# Patient Record
Sex: Male | Born: 1945 | Race: White | Hispanic: No | Marital: Married | Smoking: Never smoker
Health system: Southern US, Community
[De-identification: ages and names within clinical notes are randomized; demographics above are authoritative.]

## PROBLEM LIST (undated history)

## (undated) DIAGNOSIS — M1990 Unspecified osteoarthritis, unspecified site: Secondary | ICD-10-CM

## (undated) DIAGNOSIS — Z8719 Personal history of other diseases of the digestive system: Secondary | ICD-10-CM

## (undated) DIAGNOSIS — I2510 Atherosclerotic heart disease of native coronary artery without angina pectoris: Secondary | ICD-10-CM

## (undated) DIAGNOSIS — Z9889 Other specified postprocedural states: Secondary | ICD-10-CM

## (undated) DIAGNOSIS — N390 Urinary tract infection, site not specified: Secondary | ICD-10-CM

## (undated) DIAGNOSIS — R112 Nausea with vomiting, unspecified: Secondary | ICD-10-CM

## (undated) DIAGNOSIS — K219 Gastro-esophageal reflux disease without esophagitis: Secondary | ICD-10-CM

## (undated) DIAGNOSIS — R7303 Prediabetes: Secondary | ICD-10-CM

## (undated) DIAGNOSIS — J449 Chronic obstructive pulmonary disease, unspecified: Secondary | ICD-10-CM

## (undated) DIAGNOSIS — N400 Enlarged prostate without lower urinary tract symptoms: Secondary | ICD-10-CM

## (undated) DIAGNOSIS — C801 Malignant (primary) neoplasm, unspecified: Secondary | ICD-10-CM

## (undated) DIAGNOSIS — E782 Mixed hyperlipidemia: Secondary | ICD-10-CM

## (undated) DIAGNOSIS — I10 Essential (primary) hypertension: Secondary | ICD-10-CM

## (undated) DIAGNOSIS — Z87442 Personal history of urinary calculi: Secondary | ICD-10-CM

## (undated) DIAGNOSIS — N189 Chronic kidney disease, unspecified: Secondary | ICD-10-CM

## (undated) DIAGNOSIS — Z91018 Allergy to other foods: Secondary | ICD-10-CM

## (undated) HISTORY — DX: Personal history of other diseases of the digestive system: Z87.19

## (undated) HISTORY — DX: Chronic obstructive pulmonary disease, unspecified: J44.9

## (undated) HISTORY — DX: Urinary tract infection, site not specified: N39.0

## (undated) HISTORY — PX: OTHER SURGICAL HISTORY: SHX169

## (undated) HISTORY — DX: Allergy to other foods: Z91.018

## (undated) HISTORY — DX: Atherosclerotic heart disease of native coronary artery without angina pectoris: I25.10

## (undated) HISTORY — PX: KNEE ARTHROSCOPY: SUR90

## (undated) HISTORY — DX: Mixed hyperlipidemia: E78.2

## (undated) HISTORY — DX: Chronic kidney disease, unspecified: N18.9

## (undated) HISTORY — DX: Unspecified osteoarthritis, unspecified site: M19.90

## (undated) HISTORY — DX: Prediabetes: R73.03

## (undated) HISTORY — PX: UPPER GASTROINTESTINAL ENDOSCOPY: SHX188

---

## 2012-09-17 ENCOUNTER — Encounter: Payer: Self-pay | Admitting: Gastroenterology

## 2012-09-17 HISTORY — PX: COLONOSCOPY: SHX174

## 2012-09-17 HISTORY — PX: ESOPHAGOGASTRODUODENOSCOPY: SHX1529

## 2017-04-12 ENCOUNTER — Encounter (HOSPITAL_COMMUNITY): Payer: Self-pay | Admitting: *Deleted

## 2017-04-12 NOTE — Progress Notes (Signed)
Spoke with pt's wife, Tye [place] for pre-op call. She states pt does not have a cardiac history or is diabetic.

## 2017-04-13 ENCOUNTER — Ambulatory Visit (HOSPITAL_COMMUNITY): Payer: Medicare HMO | Admitting: Certified Registered Nurse Anesthetist

## 2017-04-13 ENCOUNTER — Encounter (HOSPITAL_COMMUNITY)
Admission: RE | Disposition: A | Discharge status: Home / Self Care | Payer: Self-pay | Source: Ambulatory Visit | Attending: Oculoplastics Ophthalmology

## 2017-04-13 ENCOUNTER — Ambulatory Visit (HOSPITAL_COMMUNITY)
Admission: RE | Admit: 2017-04-13 | Discharge: 2017-04-13 | Disposition: A | Discharge status: Home / Self Care | Payer: Medicare HMO | Source: Ambulatory Visit | Attending: Oculoplastics Ophthalmology | Admitting: Oculoplastics Ophthalmology

## 2017-04-13 ENCOUNTER — Encounter (HOSPITAL_COMMUNITY): Payer: Self-pay | Admitting: *Deleted

## 2017-04-13 DIAGNOSIS — Z79891 Long term (current) use of opiate analgesic: Secondary | ICD-10-CM | POA: Insufficient documentation

## 2017-04-13 DIAGNOSIS — Z79899 Other long term (current) drug therapy: Secondary | ICD-10-CM | POA: Diagnosis not present

## 2017-04-13 DIAGNOSIS — Z7982 Long term (current) use of aspirin: Secondary | ICD-10-CM | POA: Insufficient documentation

## 2017-04-13 DIAGNOSIS — Z791 Long term (current) use of non-steroidal anti-inflammatories (NSAID): Secondary | ICD-10-CM | POA: Diagnosis not present

## 2017-04-13 DIAGNOSIS — C44119 Basal cell carcinoma of skin of left eyelid, including canthus: Secondary | ICD-10-CM | POA: Insufficient documentation

## 2017-04-13 HISTORY — DX: Malignant (primary) neoplasm, unspecified: C80.1

## 2017-04-13 HISTORY — PX: LID LESION EXCISION: SHX5204

## 2017-04-13 HISTORY — DX: Personal history of urinary calculi: Z87.442

## 2017-04-13 HISTORY — PX: RECONSTRUCTION OF EYELID: SHX6576

## 2017-04-13 HISTORY — DX: Other specified postprocedural states: Z98.890

## 2017-04-13 HISTORY — DX: Essential (primary) hypertension: I10

## 2017-04-13 HISTORY — DX: Other specified postprocedural states: R11.2

## 2017-04-13 HISTORY — DX: Gastro-esophageal reflux disease without esophagitis: K21.9

## 2017-04-13 HISTORY — DX: Benign prostatic hyperplasia without lower urinary tract symptoms: N40.0

## 2017-04-13 HISTORY — DX: Personal history of other diseases of the digestive system: Z87.19

## 2017-04-13 LAB — BASIC METABOLIC PANEL
Anion gap: 7 (ref 5–15)
BUN: 14 mg/dL (ref 6–20)
CHLORIDE: 111 mmol/L (ref 101–111)
CO2: 21 mmol/L — AB (ref 22–32)
Calcium: 9.8 mg/dL (ref 8.9–10.3)
Creatinine, Ser: 0.93 mg/dL (ref 0.61–1.24)
GFR calc non Af Amer: >60 mL/min (ref >60)
Glucose, Bld: 125 mg/dL — ABNORMAL HIGH (ref 65–99)
POTASSIUM: 3.9 mmol/L (ref 3.5–5.1)
Sodium: 139 mmol/L (ref 135–145)

## 2017-04-13 LAB — CBC
HEMATOCRIT: 46.1 % (ref 39.0–52.0)
Hemoglobin: 15.9 g/dL (ref 13.0–17.0)
MCH: 30.3 pg (ref 26.0–34.0)
MCHC: 34.5 g/dL (ref 30.0–36.0)
MCV: 88 fL (ref 78.0–100.0)
PLATELETS: 159 10*3/uL (ref 150–400)
RBC: 5.24 MIL/uL (ref 4.22–5.81)
RDW: 13.4 % (ref 11.5–15.5)
WBC: 7.6 10*3/uL (ref 4.0–10.5)

## 2017-04-13 LAB — PROTIME-INR
INR: 0.97
Prothrombin Time: 12.8 seconds (ref 11.4–15.2)

## 2017-04-13 SURGERY — EXCISION, LESION, EYELID
Anesthesia: General | Site: Eye | Laterality: Left

## 2017-04-13 MED ORDER — OXYCODONE HCL 5 MG/5ML PO SOLN: 5.0000 mg | Freq: Once | ORAL | Status: AC | PRN

## 2017-04-13 MED ORDER — OXYCODONE HCL 5 MG PO TABS
ORAL_TABLET | ORAL | Status: AC
  Filled 2017-04-13: qty 1

## 2017-04-13 MED ORDER — LACTATED RINGERS IV SOLN
INTRAVENOUS | Status: DC | PRN
  Administered 2017-04-13: 09:00:00 via INTRAVENOUS

## 2017-04-13 MED ORDER — BUPIVACAINE HCL (PF) 0.5 % IJ SOLN
INTRAMUSCULAR | Status: AC
  Filled 2017-04-13: qty 30

## 2017-04-13 MED ORDER — TOBRAMYCIN-DEXAMETHASONE 0.3-0.1 % OP OINT
TOPICAL_OINTMENT | OPHTHALMIC | Status: DC | PRN
  Administered 2017-04-13: 1 via OPHTHALMIC

## 2017-04-13 MED ORDER — LIDOCAINE-EPINEPHRINE 1 %-1:100000 IJ SOLN
INTRAMUSCULAR | Status: AC
  Filled 2017-04-13: qty 1

## 2017-04-13 MED ORDER — SCOPOLAMINE 1 MG/3DAYS TD PT72
1.0000 | MEDICATED_PATCH | TRANSDERMAL | Status: DC
  Administered 2017-04-13: 1.5 mg via TRANSDERMAL
  Filled 2017-04-13: qty 1

## 2017-04-13 MED ORDER — ONDANSETRON HCL 4 MG/2ML IJ SOLN
INTRAMUSCULAR | Status: DC | PRN
  Administered 2017-04-13: 4 mg via INTRAVENOUS

## 2017-04-13 MED ORDER — PROPOFOL 10 MG/ML IV BOLUS
INTRAVENOUS | Status: DC | PRN
  Administered 2017-04-13: 180 mg via INTRAVENOUS

## 2017-04-13 MED ORDER — FENTANYL CITRATE (PF) 100 MCG/2ML IJ SOLN
INTRAMUSCULAR | Status: DC | PRN
  Administered 2017-04-13: 50 ug via INTRAVENOUS

## 2017-04-13 MED ORDER — LIDOCAINE-EPINEPHRINE 1 %-1:100000 IJ SOLN
INTRAMUSCULAR | Status: DC | PRN
  Administered 2017-04-13: 10 mL

## 2017-04-13 MED ORDER — LIDOCAINE HCL (CARDIAC) 20 MG/ML IV SOLN
INTRAVENOUS | Status: DC | PRN
  Administered 2017-04-13: 60 mg via INTRAVENOUS

## 2017-04-13 MED ORDER — BUPIVACAINE HCL (PF) 0.75 % IJ SOLN
INTRAMUSCULAR | Status: AC
  Filled 2017-04-13: qty 30

## 2017-04-13 MED ORDER — BUPIVACAINE HCL (PF) 0.75 % IJ SOLN
INTRAMUSCULAR | Status: AC
  Filled 2017-04-13: qty 10

## 2017-04-13 MED ORDER — PHENYLEPHRINE HCL 10 MG/ML IJ SOLN
INTRAMUSCULAR | Status: DC | PRN
  Administered 2017-04-13: 40 ug via INTRAVENOUS

## 2017-04-13 MED ORDER — DEXAMETHASONE SODIUM PHOSPHATE 10 MG/ML IJ SOLN
INTRAMUSCULAR | Status: DC | PRN
  Administered 2017-04-13: 10 mg via INTRAVENOUS

## 2017-04-13 MED ORDER — TOBRAMYCIN-DEXAMETHASONE 0.3-0.1 % OP OINT
TOPICAL_OINTMENT | OPHTHALMIC | Status: AC
  Filled 2017-04-13: qty 3.5

## 2017-04-13 MED ORDER — ONDANSETRON HCL 4 MG/2ML IJ SOLN: 4.0000 mg | Freq: Four times a day (QID) | INTRAMUSCULAR | Status: DC | PRN

## 2017-04-13 MED ORDER — FENTANYL CITRATE (PF) 250 MCG/5ML IJ SOLN
INTRAMUSCULAR | Status: AC
  Filled 2017-04-13: qty 5

## 2017-04-13 MED ORDER — OXYCODONE HCL 5 MG PO TABS
5.0000 mg | ORAL_TABLET | Freq: Once | ORAL | Status: AC | PRN
  Administered 2017-04-13: 5 mg via ORAL

## 2017-04-13 MED ORDER — HYDROCODONE-ACETAMINOPHEN 5-325 MG PO TABS: 1.0000 | ORAL_TABLET | Freq: Four times a day (QID) | ORAL | 0 refills | Status: AC | PRN

## 2017-04-13 MED ORDER — FENTANYL CITRATE (PF) 100 MCG/2ML IJ SOLN: 25.0000 ug | INTRAMUSCULAR | Status: DC | PRN

## 2017-04-13 SURGICAL SUPPLY — 39 items
APPLICATOR COTTON TIP 6IN STRL (MISCELLANEOUS) ×3 IMPLANT
APPLICATOR DR MATTHEWS STRL (MISCELLANEOUS) ×3 IMPLANT
BANDAGE EYE OVAL (MISCELLANEOUS) ×3 IMPLANT
BLADE SURG 15 STRL LF DISP TIS (BLADE) ×1 IMPLANT
BLADE SURG 15 STRL SS (BLADE) ×2
CLOSURE STERI-STRIP 1/2X4 (GAUZE/BANDAGES/DRESSINGS) ×1
CLOSURE WOUND 1/2 X4 (GAUZE/BANDAGES/DRESSINGS) ×1
CLSR STERI-STRIP ANTIMIC 1/2X4 (GAUZE/BANDAGES/DRESSINGS) ×2 IMPLANT
CORDS BIPOLAR (ELECTRODE) ×3 IMPLANT
COVER LIGHT HANDLE STERIS (MISCELLANEOUS) ×3 IMPLANT
COVER SURGICAL LIGHT HANDLE (MISCELLANEOUS) ×3 IMPLANT
DRAPE ORTHO SPLIT 87X125 STRL (DRAPES) ×3 IMPLANT
DRAPE SURG 17X23 STRL (DRAPES) ×6 IMPLANT
DRAPE UTILITY XL STRL (DRAPES) ×3 IMPLANT
DRESSING TELFA 8X10 (GAUZE/BANDAGES/DRESSINGS) IMPLANT
ELECT NEEDLE BLADE 2-5/6 (NEEDLE) ×3 IMPLANT
ELECT REM PT RETURN 9FT ADLT (ELECTROSURGICAL) ×3
ELECTRODE REM PT RTRN 9FT ADLT (ELECTROSURGICAL) ×1 IMPLANT
FORCEPS BIPOLAR SPETZLER 8 1.0 (NEUROSURGERY SUPPLIES) ×3 IMPLANT
FRAME EYE SHIELD (PROTECTIVE WEAR) IMPLANT
GLOVE BIO SURGEON STRL SZ7.5 (GLOVE) ×6 IMPLANT
GOWN STRL REUS W/ TWL LRG LVL3 (GOWN DISPOSABLE) ×2 IMPLANT
GOWN STRL REUS W/TWL LRG LVL3 (GOWN DISPOSABLE) ×4
NEEDLE PRECISIONGLIDE 27X1.5 (NEEDLE) IMPLANT
NS IRRIG 1000ML POUR BTL (IV SOLUTION) ×3 IMPLANT
PACK CATARACT CUSTOM (CUSTOM PROCEDURE TRAY) ×3 IMPLANT
PAD ARMBOARD 7.5X6 YLW CONV (MISCELLANEOUS) ×6 IMPLANT
PENCIL BUTTON HOLSTER BLD 10FT (ELECTRODE) ×3 IMPLANT
STRIP CLOSURE SKIN 1/2X4 (GAUZE/BANDAGES/DRESSINGS) ×2 IMPLANT
SUT CHROMIC 5 0 P 3 (SUTURE) ×3 IMPLANT
SUT ETHILON 6 0 P 1 (SUTURE) IMPLANT
SUT ETHILON 7 0 P 6 18 (SUTURE) ×3 IMPLANT
SUT MERSILENE 5 0 RD 1 DA (SUTURE) ×3 IMPLANT
SUT PLAIN 5 0 P 3 18 (SUTURE) ×3 IMPLANT
SUT PLAIN 6 0 TG1408 (SUTURE) ×3 IMPLANT
SUT SILK 4 0 P 3 (SUTURE) ×3 IMPLANT
SUT VICRYL 6-0 S14 (SUTURE) ×3 IMPLANT
TOWEL OR 17X24 6PK STRL BLUE (TOWEL DISPOSABLE) ×6 IMPLANT
WATER STERILE IRR 1000ML POUR (IV SOLUTION) ×3 IMPLANT

## 2017-04-13 NOTE — Brief Op Note (Signed)
04/13/2017  8:26 AM  PATIENT:  Ryan Zamora  71 y.o. 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  BLOOD ADMINISTERED:none  DRAINS: none   LOCAL MEDICATIONS USED:  BUPIVICAINE  and LIDOCAINE   SPECIMEN:  Source of Specimen:  Left lower eyelid  DISPOSITION OF SPECIMEN:  PATHOLOGY  COUNTS:  YES  TOURNIQUET:  * No tourniquets in log *  DICTATION: .Note written in EPIC  PLAN OF CARE: Discharge to home after PACU  PATIENT DISPOSITION:  PACU - hemodynamically stable.   Delay start of Pharmacological VTE agent (>24hrs) due to surgical blood loss or risk of bleeding: not applicable

## 2017-04-13 NOTE — Anesthesia Procedure Notes (Signed)
Procedure Name: LMA Insertion Date/Time: 04/13/2017 8:53 AM Performed by: Tressia Miners LEFFEW Pre-anesthesia Checklist: Patient identified, Emergency Drugs available, Suction available and Patient being monitored Patient Re-evaluated:Patient Re-evaluated prior to induction Oxygen Delivery Method: Circle System Utilized Preoxygenation: Pre-oxygenation with 100% oxygen Induction Type: IV induction Ventilation: Mask ventilation without difficulty LMA: LMA inserted LMA Size: 4.0 Number of attempts: 1 Airway Equipment and Method: Bite block Placement Confirmation: positive ETCO2 and breath sounds checked- equal and bilateral Tube secured with: Tape Dental Injury: Teeth and Oropharynx as per pre-operative assessment

## 2017-04-13 NOTE — H&P (Signed)
Subjective:    Ryan Zamora is a 71 y.o. male who presents for evaluation of left lower eyelid basal cell cancer. The pain is described as none. Onset was several years ago. Symptoms have been unchanged since.   Review of Systems Pertinent items are noted in HPI.    Objective:   BP 139/74   Pulse 76   Temp 97.8 F (36.6 C) (Oral)   Resp 20   Ht 5\' 10"  (1.778 m)   Wt 92.5 kg (204 lb)   SpO2 97%   BMI 29.27 kg/m   General:  alert, cooperative and appears stated age Skin:  normal and left lower eyelid basal cell cancer s/p incisional biopsy Eyes: conjunctivae/corneas clear. PERRL, EOM's intact. Fundi benign., left lower lid basal cell cancer approximately 30% of lower eyelid VA 20/50+1 OS and 20/30-2 OS Mouth: MMM no lesions Lymph Nodes:  Cervical, supraclavicular, and axillary nodes normal. Lungs:  clear to auscultation bilaterally Heart:  regular rate and rhythm, S1, S2 normal, no murmur, click, rub or gallop Abdomen: soft, non-tender; bowel sounds normal; no masses,  no organomegaly CVA:  absent Genitourinary: defer exam Extremities:  extremities normal, atraumatic, no cyanosis or edema Neurologic:  negative Psychiatric:  normal mood, behavior, speech, dress, and thought processes    Assessment: LEFT Lower Eyelid Basal Cell Cancer  Plan: Excisional Biopsy with Frozen Section and Total Reconstruction of Left Lower Eyelid  1. Discussed the risk of surgery,  and the risks of general anesthetic including MI, CVA, sudden death or even reaction to anesthetic medications. The patient understands the risks, any and all questions were answered to the patient's satisfaction. 2. Follow up: 2 weeks.  Date of Surgery Update (To be completed by Attending Surgeon day of surgery.)

## 2017-04-13 NOTE — Anesthesia Preprocedure Evaluation (Signed)
Anesthesia Evaluation  Patient identified by MRN, date of birth, ID band Patient awake    Reviewed: Allergy & Precautions, H&P , NPO status , Patient's Chart, lab work & pertinent test results  History of Anesthesia Complications (+) PONV and history of anesthetic complications  Airway Mallampati: II   Neck ROM: full    Dental   Pulmonary neg pulmonary ROS,    breath sounds clear to auscultation       Cardiovascular hypertension,  Rhythm:regular Rate:Normal     Neuro/Psych    GI/Hepatic hiatal hernia, GERD  ,  Endo/Other    Renal/GU stones     Musculoskeletal   Abdominal   Peds  Hematology   Anesthesia Other Findings   Reproductive/Obstetrics                             Anesthesia Physical Anesthesia Plan  ASA: II  Anesthesia Plan: General   Post-op Pain Management:    Induction: Intravenous  PONV Risk Score and Plan: 3 and Ondansetron, Dexamethasone and Midazolam  Airway Management Planned: LMA  Additional Equipment:   Intra-op Plan:   Post-operative Plan:   Informed Consent: I have reviewed the patients History and Physical, chart, labs and discussed the procedure including the risks, benefits and alternatives for the proposed anesthesia with the patient or authorized representative who has indicated his/her understanding and acceptance.     Plan Discussed with: CRNA, Anesthesiologist and Surgeon  Anesthesia Plan Comments:         Anesthesia Quick Evaluation

## 2017-04-13 NOTE — Op Note (Signed)
Procedure(s): REMOVAL BIOPSY OF EYE LID LESION TOTAL RECONSTRUCTION OF LOWER EYELID Procedure Note  Ryan Zamora male 71 y.o. 04/13/2017  Procedure(s) and Anesthesia Type:    * REMOVAL BIOPSY OF EYE LID LESION - General    * TOTAL RECONSTRUCTION OF LOWER EYELID - General  Surgeon(s) and Role:    * Clista Bernhardt, MD - Primary   Indications: The patient was admitted to the hospital with a brief history of left lower eyelid basal cell cancer. An incisional biopsy revealed findings of basal cell cancer. The patient now presents for excisional biopsy with frozen section and total reconstruction of the lower eyelid after discussing therapeutic alternatives.        Surgeon: Clista Bernhardt   Assistants: none  Anesthesia: General LMA anesthesia  ASA Class: 3  Procedure Detail  REMOVAL BIOPSY OF EYE LID LESION, TOTAL RECONSTRUCTION OF LOWER EYELID The patient is aware that they have a basal cell cancer, necessitating excision. The patient is aware of the risks and benefits of surgery including bleeding, infection, scarring, need for re-excision, asymmetry, and loss of the eye, and loss of life. The patient elects to proceed.  The patient was transferred to the OR in supine position where they were prepped and draped in the usual standard sterile fashion for Oculoplastic surgery at [place]. Attention was turned to the left lower eyelid which was then infiltrated with local anesthesia. The area of visible basal cell was incised with a 15 blade and then excised with a Wescotts scissors, hemostasis was maintained with monopolar cautery.   The lesion involved approximately 40% of the lower eyelid margin so a Tenzel flap was created. Starting from the lateral canthus a ellipse was drawn with the marking pen. Then the 15 blade was used to incise the area. Then Wescotts and the Bovie was used to dissect the tissue to release it lateral to the lateral canthus. An inferior  lateral  canthotomy was done to allow for adequate mobilization of the flap to allow complete closure of the eyelid defect. Hemostasis was maintained with monopolar cautery. The deep orbicular was closed with a mattress suture lateral to the lateral canthus to stabilize the flap.   The lid margin was then closed from meibomian gland orifice to meibomian gland orifice and then from gray line to gray line with a 5-0 undyed vicryl. Then 2 buried mattress sutures were placed to bring the incized edges together. Then the skin was closed in an interrupted fashion with 6-0 plain gut.   Then the Tenzel flap was closed in a running fashion with 6-0 plain gut.   Findings: Left lower eyelid basal cell cancer  Estimated Blood Loss:  Minimal         Drains: none         Total IV Fluids: <1L  Blood Given: none          Specimens: left lower eyelid         Implants: none        Complications:  * No complications entered in OR log *         Disposition: PACU - hemodynamically stable.         Condition: stable

## 2017-04-13 NOTE — Transfer of Care (Signed)
Immediate Anesthesia Transfer of Care Note  Patient: Ryan Zamora  Procedure(s) Performed: Procedure(s): REMOVAL BIOPSY OF EYE LID LESION (Left) TOTAL RECONSTRUCTION OF LOWER EYELID (Left)  Patient Location: PACU  Anesthesia Type:General  Level of Consciousness: awake, oriented, patient cooperative and responds to stimulation  Airway & Oxygen Therapy: Patient Spontanous Breathing and Patient connected to face mask oxygen  Post-op Assessment: Report given to RN, Post -op Vital signs reviewed and stable and Patient moving all extremities X 4  Post vital signs: Reviewed and stable  Last Vitals:  Vitals:   04/13/17 0703  BP: 139/74  Pulse: 76  Resp: 20  Temp: 36.6 C  SpO2: 97%    Last Pain:  Vitals:   04/13/17 0703  TempSrc: Oral      Patients Stated Pain Goal: 3 (03/40/35 2481)  Complications: No apparent anesthesia complications

## 2017-04-13 NOTE — Anesthesia Postprocedure Evaluation (Signed)
Anesthesia Post Note  Patient: Ryan Zamora  Procedure(s) Performed: Procedure(s) (LRB): REMOVAL BIOPSY OF EYE LID LESION (Left) TOTAL RECONSTRUCTION OF LOWER EYELID (Left)     Patient location during evaluation: PACU Anesthesia Type: General Level of consciousness: awake and alert Pain management: pain level controlled Vital Signs Assessment: post-procedure vital signs reviewed and stable Respiratory status: spontaneous breathing, nonlabored ventilation, respiratory function stable and patient connected to nasal cannula oxygen Cardiovascular status: blood pressure returned to baseline and stable Postop Assessment: no signs of nausea or vomiting Anesthetic complications: no    Last Vitals:  Vitals:   04/13/17 1020 04/13/17 1037  BP: (!) 151/82   Pulse:  72  Resp:  15  Temp:    SpO2:  96%    Last Pain:  Vitals:   04/13/17 1037  TempSrc:   PainSc: 2                  Rahil Passey S

## 2017-04-14 ENCOUNTER — Encounter (HOSPITAL_COMMUNITY): Payer: Self-pay | Admitting: Oculoplastics Ophthalmology

## 2018-03-30 ENCOUNTER — Encounter: Payer: Self-pay | Admitting: Gastroenterology

## 2018-04-30 ENCOUNTER — Encounter: Payer: Self-pay | Admitting: Gastroenterology

## 2018-05-02 ENCOUNTER — Ambulatory Visit (INDEPENDENT_AMBULATORY_CARE_PROVIDER_SITE_OTHER): Payer: Medicare HMO | Admitting: Gastroenterology

## 2018-05-02 ENCOUNTER — Encounter: Payer: Self-pay | Admitting: Gastroenterology

## 2018-05-02 VITALS — BP 134/70 | HR 80 | Ht 70.0 in | Wt 203.4 lb

## 2018-05-02 DIAGNOSIS — R1310 Dysphagia, unspecified: Secondary | ICD-10-CM

## 2018-05-02 DIAGNOSIS — K2270 Barrett's esophagus without dysplasia: Secondary | ICD-10-CM

## 2018-05-02 DIAGNOSIS — K222 Esophageal obstruction: Secondary | ICD-10-CM

## 2018-05-02 DIAGNOSIS — K219 Gastro-esophageal reflux disease without esophagitis: Secondary | ICD-10-CM

## 2018-05-02 DIAGNOSIS — K22719 Barrett's esophagus with dysplasia, unspecified: Secondary | ICD-10-CM

## 2018-05-02 DIAGNOSIS — K449 Diaphragmatic hernia without obstruction or gangrene: Secondary | ICD-10-CM

## 2018-05-02 MED ORDER — PANTOPRAZOLE SODIUM 40 MG PO TBEC: 40.0000 mg | DELAYED_RELEASE_TABLET | Freq: Two times a day (BID) | ORAL | 11 refills | Status: DC

## 2018-05-02 NOTE — Progress Notes (Signed)
Chief Complaint: Dysphagia  Referring Provider:  Raina Mina., MD   Dr Alcide Clever   ASSESSMENT AND PLAN;   #1. 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 Now with esophageal dysphagia #2. Barrett's esophagus (status post EGD with dil/bx 09/2012, no dysplasia) Plan: - Switch Nexium twice daily to Protonix 40mg  po bid. - Ba swallow with barium tablet. - EGD with dil and Bx.  I discussed risks and benefits. - Trial of clariten 10mg  po qd x 5 days. - Nonpharmacologic means of reflux control were discussed. - I have instructed patient that she needs to chew foods especially meats and breads well and eat slowly. - Discussed above in detail with the patient and patient's wife. - Proceed with sleep study scheduled for next week.   HPI:    Ryan Zamora is a 72 y.o. male  With dysphagia/occasional regurgitation/choking. Sleeps on wedge Has been having pulm eval - Dr Samara Snide -neg PFTs- awiating sleep study next week. Advised to get repeat GI evaluation for intermittent dysphagia. Patient did take doxycycline recently for prostatitis. Has been having intermittent dysphagia mostly to solids especially beef Occasional heartburn despite of Nexium. No odynophagia No melena No significant nonsteroidals Denies having any abdominal pain. No diarrhea or constipation. He has been sent letter to get EGD performed in 2017 for follow-up of Barrett's.  But, he did not come until today.  Past GI procedures: -EGD 09/17/2012:-Esophageal Barrett's stricture status post dilatation [address] [place] [address], long segment Barrett's esophagus, moderate hiatal hernia. Bx: No dysplasia.  EGD 05/2009: Hiatal hernia, Barrett's esophagus negative dysplasia. -Colonoscopy 09/17/2012 small internal hemorrhoids.  Repeat in 10 years.  Previous colonoscopy 08/2005 sigmoid polyps status post polypectomy, mild sigmoid diverticulosis.  Bx: tubular adenoma. Past Medical History:  Diagnosis Date  . Cancer  (HCC)    basal cell carcinoma, left eyelid, ears  . Enlarged prostate   . GERD (gastroesophageal reflux disease)   . History of hiatal hernia   . History of kidney stones   . Hypertension   . PONV (postoperative nausea and vomiting)   . UTI (urinary tract infection)     Past Surgical History:  Procedure Laterality Date  . COLONOSCOPY  09/17/2012   Small internal hemorrhoids. Otherwise normal colonoscopy to cecum.   . 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 Long segment Barretts esophagus. Moderate sized hiatal hernia.   Marland Kitchen kidney stone surgery    . KNEE ARTHROSCOPY    . LID LESION EXCISION Left 04/13/2017   Procedure: REMOVAL BIOPSY OF EYE LID LESION;  Surgeon: Clista Bernhardt, MD;  Location: [place];  Service: Ophthalmology;  Laterality: Left;  . RECONSTRUCTION OF EYELID Left 04/13/2017   Procedure: TOTAL RECONSTRUCTION OF LOWER EYELID;  Surgeon: Clista Bernhardt, MD;  Location: [place];  Service: Ophthalmology;  Laterality: Left;    Family History  Problem Relation Age of Onset  . CVA Mother   . Heart attack Father   . Colon cancer Neg Hx   . Esophageal cancer Neg Hx     Social History   Tobacco Use  . Smoking status: Never Smoker  . Smokeless tobacco: Never Used  Substance Use Topics  . Alcohol use: Yes    Comment: rare  . Drug use: No    Current Outpatient Medications  Medication Sig Dispense Refill  . allopurinol (ZYLOPRIM) 300 MG tablet Take 300 mg by mouth every evening.    Marland Kitchen aspirin EC  81 MG tablet Take 81 mg by mouth daily.     Marland Kitchen esomeprazole (NEXIUM) 20 MG capsule Take 20 mg by mouth at bedtime as needed (heartburn).    . finasteride (PROSCAR) 5 MG tablet Take 5 mg by mouth daily.    Marland Kitchen lisinopril (PRINIVIL,ZESTRIL) 10 MG tablet Take 10 mg by mouth daily.    . simvastatin (ZOCOR) 20 MG tablet Take 20 mg by mouth every evening.     No current facility-administered medications  for this visit.     No Known Allergies  Review of Systems:  Constitutional: Denies fever, chills, diaphoresis, appetite change and fatigue.  HEENT: Denies photophobia, eye pain, redness, hearing loss, ear pain, congestion, sore throat, rhinorrhea, sneezing, mouth sores, neck pain, neck stiffness and tinnitus.   Respiratory: Denies SOB, DOE, chest tightness,  and wheezing. Has cough. Cardiovascular: Denies chest pain, palpitations and leg swelling.  Genitourinary: Denies dysuria, urgency, frequency, hematuria, flank pain and difficulty urinating.  Musculoskeletal: Denies myalgias, back pain, joint swelling, arthralgias and gait problem.  Skin: No rash.  Neurological: Denies dizziness, seizures, syncope, weakness, light-headedness, numbness and headaches.  Hematological: Denies adenopathy. Easy bruising, personal or family bleeding history  Psychiatric/Behavioral: No anxiety or depression     Physical Exam:    BP 134/70   Pulse 80   Ht 5\' 10"  (1.778 m)   Wt 203 lb 6 oz (92.3 kg)   BMI 29.18 kg/m  Filed Weights   05/02/18 0941  Weight: 203 lb 6 oz (92.3 kg)   Constitutional:  Well-developed, in no acute distress. Psychiatric: Normal mood and affect. Behavior is normal. HEENT: Pupils normal.  Conjunctivae are normal. No scleral icterus. Neck supple.  Cardiovascular: Normal rate, regular rhythm. No edema Pulmonary/chest: Effort normal and breath sounds normal. No wheezing, rales or rhonchi. Abdominal: Soft, nondistended. Nontender. Bowel sounds active throughout. There are no masses palpable. No hepatomegaly. Rectal:  defered Neurological: Alert and oriented to person place and time. Skin: Skin is warm and dry. No rashes noted.  Data Reviewed: I have personally reviewed following labs and imaging studies  CBC: CBC Latest Ref Rng & Units 04/13/2017  WBC 4.0 - 10.5 K/uL 7.6  Hemoglobin 13.0 - 17.0 g/dL 15.9  Hematocrit 39.0 - 52.0 % 46.1  Platelets 150 - 400 K/uL 159     CMP: CMP Latest Ref Rng & Units 04/13/2017  Glucose 65 - 99 mg/dL 125(H)  BUN 6 - 20 mg/dL 14  Creatinine 0.61 - 1.24 mg/dL 0.93  Sodium 135 - 145 mmol/L 139  Potassium 3.5 - 5.1 mmol/L 3.9  Chloride 101 - 111 mmol/L 111  CO2 22 - 32 mmol/L 21(L)  Calcium 8.9 - 10.3 mg/dL 9.8      Carmell Austria, MD 05/02/2018, 10:10 AM  Cc: Raina Mina., MD, Dr Alcide Clever

## 2018-05-02 NOTE — Patient Instructions (Signed)
If you are age 71 or older, your body mass index should be between 23-30. Your Body mass index is 29.18 kg/m. If this is out of the aforementioned range listed, please consider follow up with your Primary Care Provider.  If you are age 81 or younger, your body mass index should be between 19-25. Your Body mass index is 29.18 kg/m. If this is out of the aformentioned range listed, please consider follow up with your Primary Care Provider.   We have sent the following medications to your pharmacy for you to pick up at your convenience: Protonix 40 mg twice daily.  You have been scheduled for a Barium Esophogram at [facility] (1st floor of the hospital) on 05/17/18 at 9:30am. Please arrive 15 minutes prior to your appointment for registration. Make certain not to have anything to eat or drink 3 hours prior to your test. If you need to reschedule for any reason, please contact radiology at [phone] to do so. __________________________________________________________________ A barium swallow is an examination that concentrates on views of the esophagus. This tends to be a double contrast exam (barium and two liquids which, when combined, create a gas to distend the wall of the oesophagus) or single contrast (non-ionic iodine based). The study is usually tailored to your symptoms so a good history is essential. Attention is paid during the study to the form, structure and configuration of the esophagus, looking for functional disorders (such as aspiration, dysphagia, achalasia, motility and reflux) EXAMINATION You may be asked to change into a gown, depending on the type of swallow being performed. A radiologist and radiographer will perform the procedure. The radiologist will advise you of the type of contrast selected for your procedure and direct you during the exam. You will be asked to stand, sit or lie in several different positions and to hold a small amount of fluid in your mouth before  being asked to swallow while the imaging is performed .In some instances you may be asked to swallow barium coated marshmallows to assess the motility of a solid food bolus. The exam can be recorded as a digital or video fluoroscopy procedure. POST PROCEDURE It will take 1-2 days for the barium to pass through your system. To facilitate this, it is important, unless otherwise directed, to increase your fluids for the next 24-48hrs and to resume your normal diet.  This test typically takes about 30 minutes to perform. __________________________________________________________________________________   Ryan Zamora have been scheduled for an endoscopy. Please follow written instructions given to you at your visit today. If you use inhalers (even only as needed), please bring them with you on the day of your procedure. Your physician has requested that you go to www.startemmi.com and enter the access code given to you at your visit today. This web site gives a general overview about your procedure. However, you should still follow specific instructions given to you by our office regarding your preparation for the procedure.   Thank you,  Dr. Jackquline Denmark

## 2018-05-17 ENCOUNTER — Ambulatory Visit (HOSPITAL_COMMUNITY)
Admission: RE | Admit: 2018-05-17 | Discharge: 2018-05-17 | Disposition: A | Discharge status: Home / Self Care | Payer: Medicare HMO | Source: Ambulatory Visit | Attending: Gastroenterology | Admitting: Gastroenterology

## 2018-05-17 ENCOUNTER — Encounter: Payer: Self-pay | Admitting: Gastroenterology

## 2018-05-17 DIAGNOSIS — K219 Gastro-esophageal reflux disease without esophagitis: Secondary | ICD-10-CM | POA: Diagnosis present

## 2018-05-17 DIAGNOSIS — K449 Diaphragmatic hernia without obstruction or gangrene: Secondary | ICD-10-CM | POA: Insufficient documentation

## 2018-05-17 DIAGNOSIS — K222 Esophageal obstruction: Secondary | ICD-10-CM

## 2018-05-17 DIAGNOSIS — K22719 Barrett's esophagus with dysplasia, unspecified: Secondary | ICD-10-CM | POA: Insufficient documentation

## 2018-05-23 ENCOUNTER — Telehealth: Payer: Self-pay | Admitting: Gastroenterology

## 2018-05-23 NOTE — Telephone Encounter (Signed)
Pt calling for barium swallow results. Please advise.

## 2018-05-23 NOTE — Telephone Encounter (Signed)
Barium swallow shows moderate sliding hiatal hernia No stricture Plan to proceed with EGD with biopsies and dilatation Please see comment/plan as per last clinic note.

## 2018-05-24 NOTE — Telephone Encounter (Signed)
Left message for pt to call back  °

## 2018-05-25 ENCOUNTER — Telehealth: Payer: Self-pay | Admitting: Gastroenterology

## 2018-05-25 NOTE — Telephone Encounter (Signed)
Pt calling for results of barium swallow, please advise.

## 2018-05-25 NOTE — Telephone Encounter (Signed)
Pt is wanting results from test done on 10/3 esophagram

## 2018-05-27 NOTE — Telephone Encounter (Signed)
See note dated 10/9

## 2018-05-28 NOTE — Telephone Encounter (Signed)
Pt aware of results and knows to keep EGD appt as scheduled.

## 2018-05-28 NOTE — Telephone Encounter (Signed)
Left message for pt to call back  °

## 2018-05-28 NOTE — Telephone Encounter (Signed)
Pt's wife Juliann Pulse returned your call and would like a call back.

## 2018-05-31 ENCOUNTER — Ambulatory Visit (AMBULATORY_SURGERY_CENTER): Payer: Medicare HMO | Admitting: Gastroenterology

## 2018-05-31 ENCOUNTER — Encounter: Payer: Self-pay | Admitting: Gastroenterology

## 2018-05-31 VITALS — BP 113/76 | HR 67 | Temp 97.8°F | Resp 15 | Ht 70.0 in | Wt 203.0 lb

## 2018-05-31 DIAGNOSIS — K3189 Other diseases of stomach and duodenum: Secondary | ICD-10-CM

## 2018-05-31 DIAGNOSIS — K449 Diaphragmatic hernia without obstruction or gangrene: Secondary | ICD-10-CM

## 2018-05-31 DIAGNOSIS — K2970 Gastritis, unspecified, without bleeding: Secondary | ICD-10-CM | POA: Diagnosis not present

## 2018-05-31 DIAGNOSIS — K2990 Gastroduodenitis, unspecified, without bleeding: Secondary | ICD-10-CM

## 2018-05-31 DIAGNOSIS — K22719 Barrett's esophagus with dysplasia, unspecified: Secondary | ICD-10-CM

## 2018-05-31 MED ORDER — SODIUM CHLORIDE 0.9 % IV SOLN: 500.0000 mL | Freq: Once | INTRAVENOUS | Status: DC

## 2018-05-31 NOTE — Patient Instructions (Addendum)
Impression/Recommendations:  Barrett's esophagus handout given to patient. Hiatal hernia handout given to patient. Gastritis handout given to patient. Dilation diet handout given to patient.  Continue Protonix 40 mg. 2 times daily for 4 weeks, then decrease it to once daily.  Repeat upper endoscopy in 3 years for surveillance based on pathology results.  Return to GI clinic as needed.  YOU HAD AN ENDOSCOPIC PROCEDURE TODAY AT [facility]:   Refer to the procedure report that was given to you for any specific questions about what was found during the examination.  If the procedure report does not answer your questions, please call your gastroenterologist to clarify.  If you requested that your care partner not be given the details of your procedure findings, then the procedure report has been included in a sealed envelope for you to review at your convenience later.  YOU SHOULD EXPECT: Some feelings of bloating in the abdomen. Passage of more gas than usual.  Walking can help get rid of the air that was put into your GI tract during the procedure and reduce the bloating. If you had a lower endoscopy (such as a colonoscopy or flexible sigmoidoscopy) you may notice spotting of blood in your stool or on the toilet paper. If you underwent a bowel prep for your procedure, you may not have a normal bowel movement for a few days.  Please Note:  You might notice some irritation and congestion in your nose or some drainage.  This is from the oxygen used during your procedure.  There is no need for concern and it should clear up in a day or so.  SYMPTOMS TO REPORT IMMEDIATELY:   Following upper endoscopy (EGD)  Vomiting of blood or coffee ground material  New chest pain or pain under the shoulder blades  Painful or persistently difficult swallowing  New shortness of breath  Fever of 100F or higher  Black, tarry-looking stools  For urgent or emergent issues, a gastroenterologist  can be reached at any hour by calling [phone].   DIET:  We do recommend a small meal at first, but then you may proceed to your regular diet.  Drink plenty of fluids but you should avoid alcoholic beverages for 24 hours.  ACTIVITY:  You should plan to take it easy for the rest of today and you should NOT DRIVE or use heavy machinery until tomorrow (because of the sedation medicines used during the test).    FOLLOW UP: Our staff will call the number listed on your records the next business day following your procedure to check on you and address any questions or concerns that you may have regarding the information given to you following your procedure. If we do not reach you, we will leave a message.  However, if you are feeling well and you are not experiencing any problems, there is no need to return our call.  We will assume that you have returned to your regular daily activities without incident.  If any biopsies were taken you will be contacted by phone or by letter within the next 1-3 weeks.  Please call us at [phone] if you have not heard about the biopsies in 3 weeks.    SIGNATURES/CONFIDENTIALITY: You and/or your care partner have signed paperwork which will be entered into your electronic medical record.  These signatures attest to the fact that that the information above on your After Visit Summary has been reviewed and is understood.  Full responsibility of the  confidentiality of this discharge information lies with you and/or your care-partner. 

## 2018-05-31 NOTE — Progress Notes (Signed)
I have reviewed the patient's medical history in detail and updated the computerized patient record.

## 2018-05-31 NOTE — Progress Notes (Signed)
Called to room to assist during endoscopic procedure.  Patient ID and intended procedure confirmed with present staff. Received instructions for my participation in the procedure from the performing physician.  

## 2018-05-31 NOTE — Op Note (Signed)
[facility] Patient Name: Ryan Zamora Procedure Date: 05/31/2018 10:07 AM MRN: [id number] Endoscopist: Jackquline Denmark , MD Age: 72 Referring MD:  Date of Birth: 12-16-1945 Gender: Male Account #: [id number] Procedure:                Upper GI endoscopy Indications:              #1. GERD with mod HH with H/O esophageal stricture                            status post dilatation. Now with esophageal                            dysphagia                           #2. Barrett's esophagus (status post EGD with                            dil/bx 09/2012, no dysplasia) Medicines:                Monitored Anesthesia Care Procedure:                Pre-Anesthesia Assessment:                           - Prior to the procedure, a History and Physical                            was performed, and patient medications and                            allergies were reviewed. The patient's tolerance of                            previous anesthesia was also reviewed. The risks                            and benefits of the procedure and the sedation                            options and risks were discussed with the patient.                            All questions were answered, and informed consent                            was obtained. Prior Anticoagulants: The patient has                            taken no previous anticoagulant or antiplatelet                            agents. ASA Grade Assessment: II - A patient with  mild systemic disease. After reviewing the risks                            and benefits, the patient was deemed in                            satisfactory condition to undergo the procedure.                           After obtaining informed consent, the endoscope was                            passed under direct vision. Throughout the                            procedure, the patient's blood pressure, pulse, and                             oxygen saturations were monitored continuously. The                            Model GIF-HQ190 [id number]) scope was introduced                            through the mouth, and advanced to the second part                            of duodenum. The upper GI endoscopy was                            accomplished without difficulty. The patient                            tolerated the procedure well. Scope In: Scope Out: Findings:                 One benign-appearing, intrinsic mild stenosis was                            found 25 cm from the incisors. This stenosis                            measured 1.4 cm (inner diameter). The stenosis was                            traversed. The scope was withdrawn. Dilation was                            performed with a Maloney dilator with mild                            resistance at 48Fr and 50 Fr.                           Barrett's esophagus  was present extending from 25                            to 35 cm. The maximum longitudinal extent of these                            mucosal changes was 10 cm in length. Mucosa was                            biopsied with a cold forceps for histology in 4                            quadrants at intervals of 1.5 cm. Estimated blood                            loss was minimal.                           A 5 cm hiatal hernia was present extending from 35                            cm up to 40 cm (GE junction to diaphragmatic                            hiatus).                           Localized mild inflammation characterized by                            erythema was found in the gastric antrum. Biopsies                            were taken with a cold forceps for histology.                            Estimated blood loss: none.                           The exam was otherwise without abnormality. Complications:            No immediate complications. Estimated Blood Loss:     Estimated blood loss:  none. Impression:               - Benign-appearing esophageal stenosis. Dilated.                           - Barrett's esophagus. Biopsied.                           - Medium-sized hiatal hernia.                           - Mild Gastritis. Biopsied.                           -  The examination was otherwise normal. Recommendation:           - Patient has a contact number available for                            emergencies. The signs and symptoms of potential                            delayed complications were discussed with the                            patient. Return to normal activities tomorrow.                            Written discharge instructions were provided to the                            patient.                           - Post dilatation diet.                           - Continue Protonix 40 mg p.o. twice daily x 4                            weeks, then can decrease it to once a day. Must                            continue taking Protonix.                           - Await pathology results.                           - Repeat upper endoscopy in 3 years for                            surveillance based on pathology results.                           - Return to GI clinic PRN. Jackquline Denmark, MD 05/31/2018 10:26:26 AM This report has been signed electronically.

## 2018-05-31 NOTE — Progress Notes (Signed)
Report given to PACU, vss 

## 2018-06-01 ENCOUNTER — Telehealth: Payer: Self-pay | Admitting: *Deleted

## 2018-06-01 NOTE — Telephone Encounter (Signed)
  Follow up Call-  Call back number 05/31/2018 05/31/2018  Post procedure Call Back phone  # [phone] wife Juliann Pulse [phone]  Permission to leave phone message - Yes     Patient questions:  Do you have a fever, pain , or abdominal swelling? No. Pain Score  0 *  Have you tolerated food without any problems? Yes.    Have you been able to return to your normal activities? Yes.    Do you have any questions about your discharge instructions: Diet   No. Medications  No. Follow up visit  No.  Do you have questions or concerns about your Care? No.  Actions: * If pain score is 4 or above: No action needed, pain <4.

## 2018-06-14 ENCOUNTER — Encounter: Payer: Self-pay | Admitting: Gastroenterology

## 2019-11-16 IMAGING — RF DG ESOPHAGUS
7 of 8 series · 17 of 24 positions shown · non-contrast
Comparison: None.

CLINICAL DATA: Gastroesophageal reflux with occasional solid food
sticking. Chart history of EGD in 3176 with Bambucafe and stricture.

EXAM:
ESOPHOGRAM / BARIUM SWALLOW / BARIUM TABLET STUDY
TECHNIQUE: Combined double contrast and single contrast examination performed
using effervescent crystals, thick barium liquid, and thin barium
liquid. The patient was observed with fluoroscopy swallowing a 13 mm
barium sulphate tablet.
FLUOROSCOPY TIME:  Fluoroscopy Time:  1.2 minute
Radiation Exposure Index (if provided by the fluoroscopic device):
8.6 mGy
Number of Acquired Spot Images: 0

[Series 1: cp_standard · 0.34mm/px · 3 of 41 frames shown (1 of 7)]
[frame 7/41]
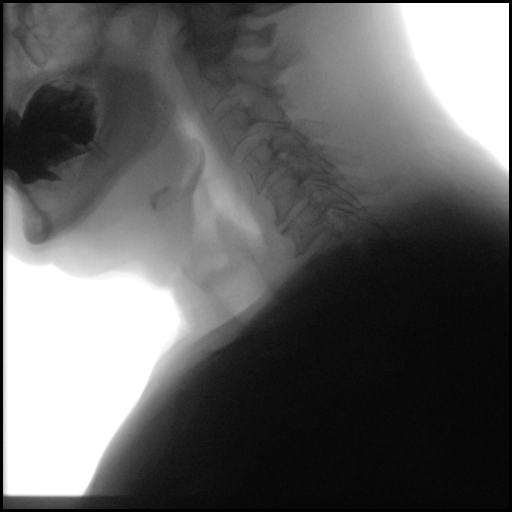
[frame 21/41]
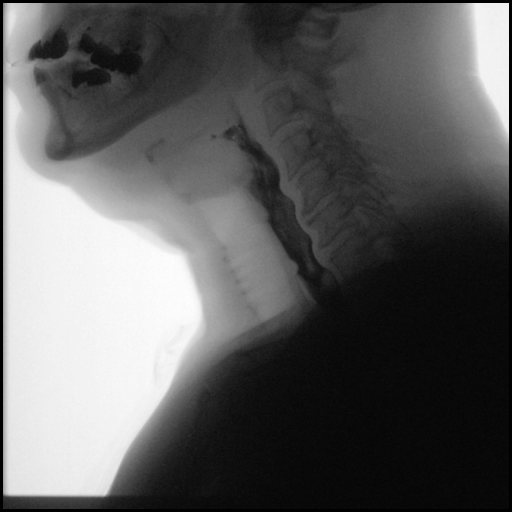
[frame 35/41]
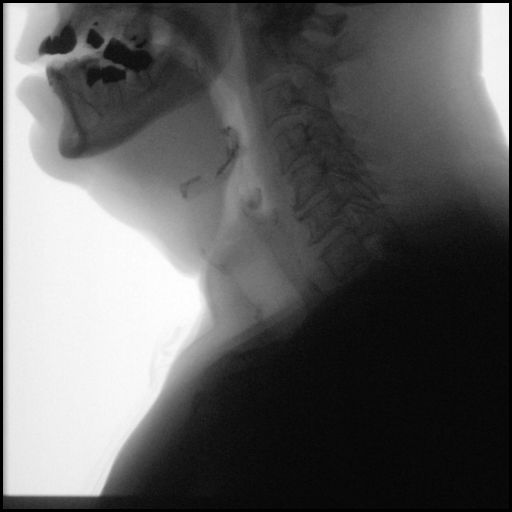

[Series 2: cp_standard · 0.51mm/px · 2 of 27 frames shown (2 of 7)]
[frame 5/27]
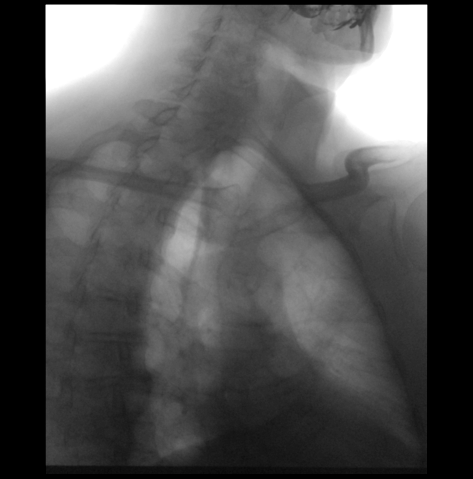
[frame 23/27]
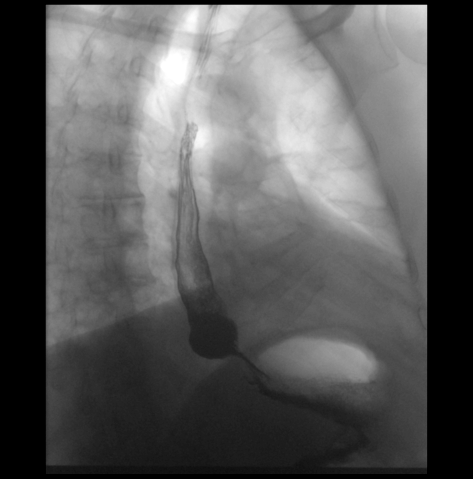

[Series 3: cp_standard · 0.34mm/px · 3 of 29 frames shown (3 of 7)]
[frame 5/29]
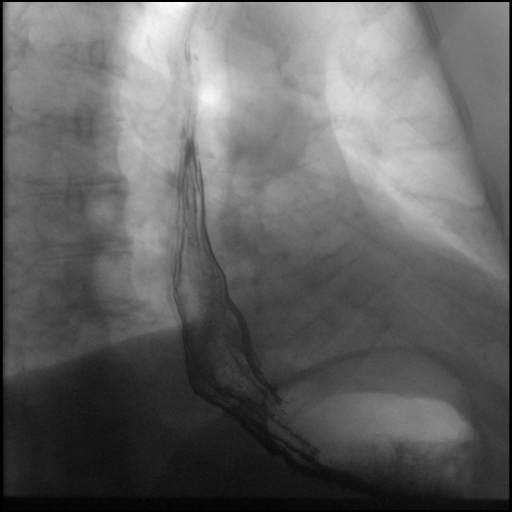
[frame 15/29]
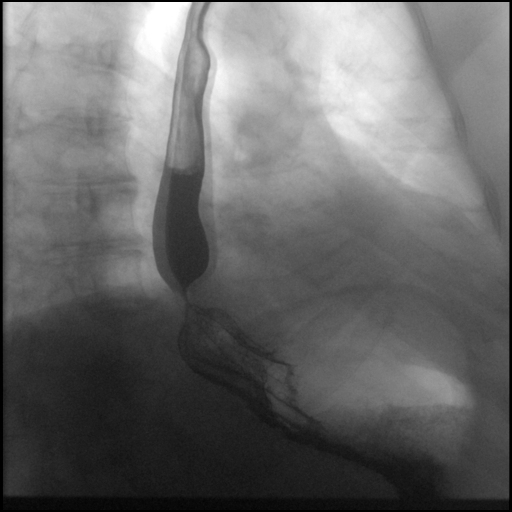
[frame 25/29]
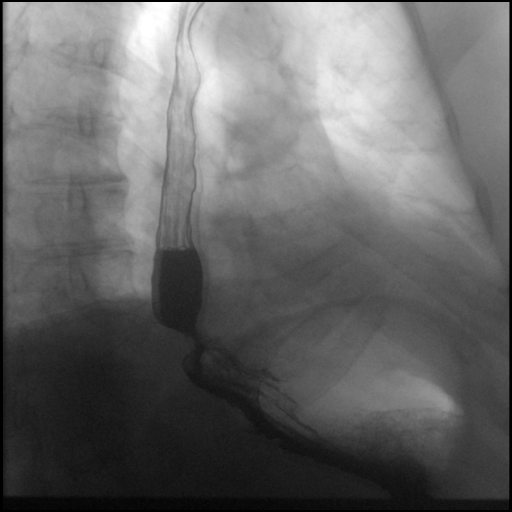

[Series 5: cp_standard · 0.17mm/px · 1 of 1 slices shown (4 of 7)]
[im 1/1]
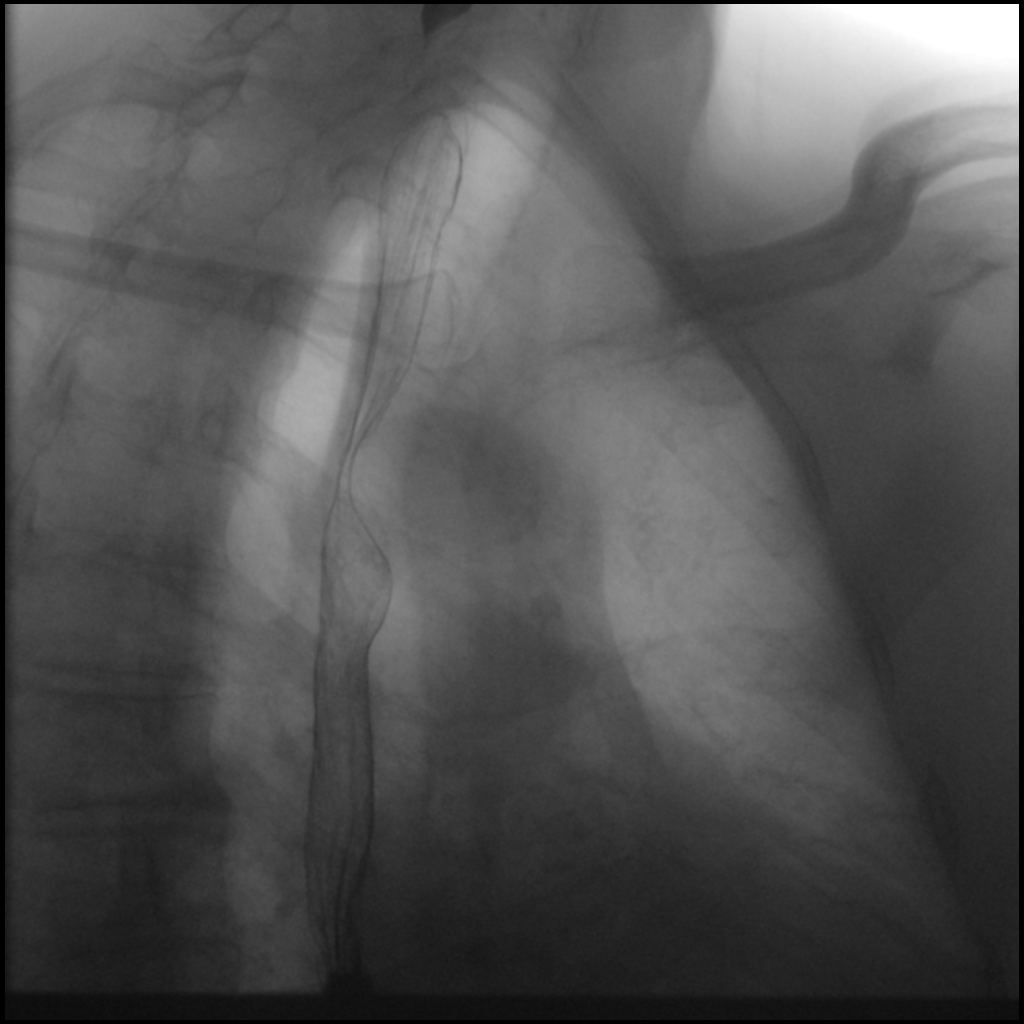

[Series 6: cp_standard · 0.35mm/px · 3 of 17 frames shown (5 of 7)]
[frame 1/17]
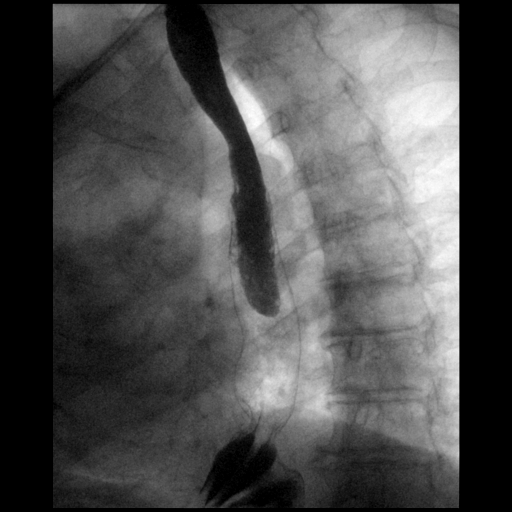
[frame 3/17]
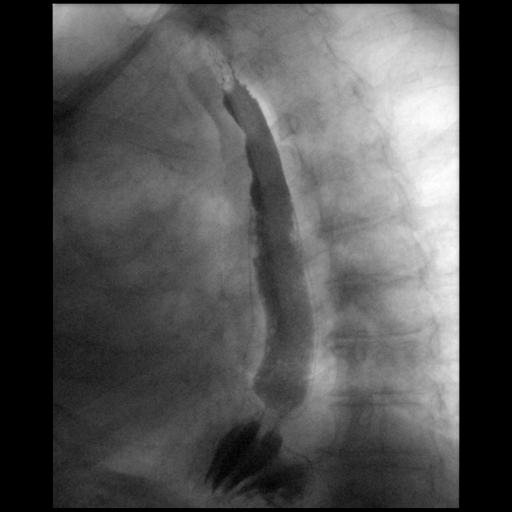
[frame 15/17]
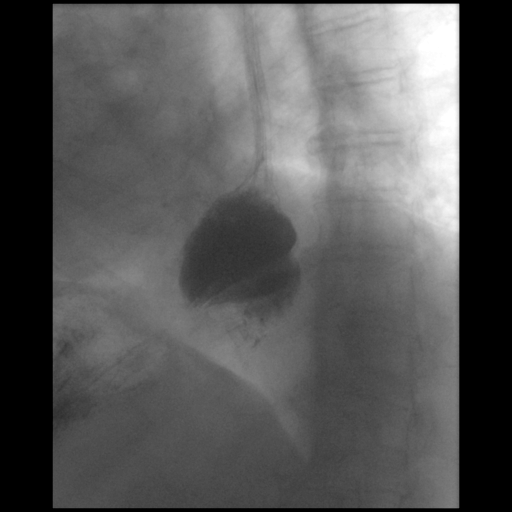

[Series 7: cp_standard · 0.35mm/px · 2 of 32 frames shown (6 of 7)]
[frame 5/32]
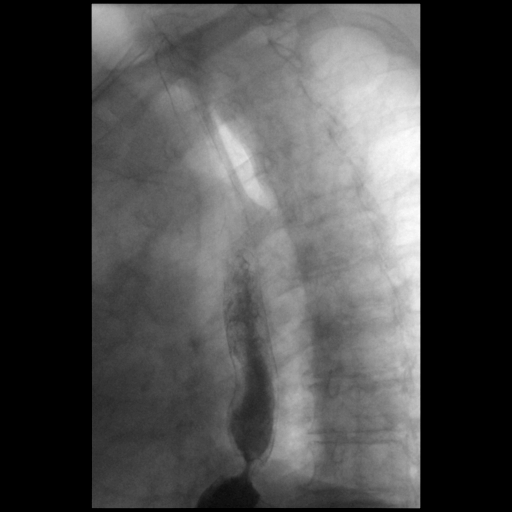
[frame 28/32]
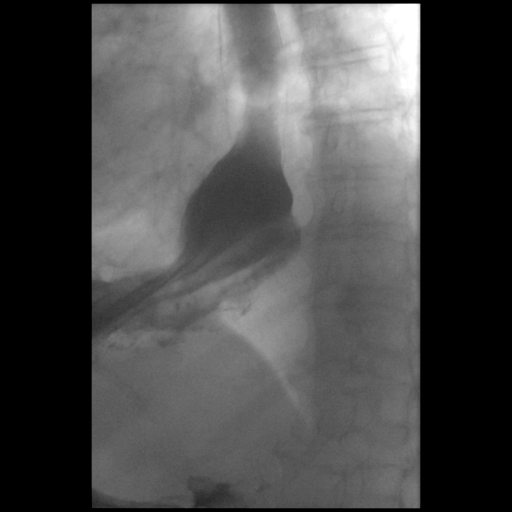

[Series 8: cp_standard · 0.36mm/px · 3 of 16 frames shown (7 of 7)]
[frame 3/16]
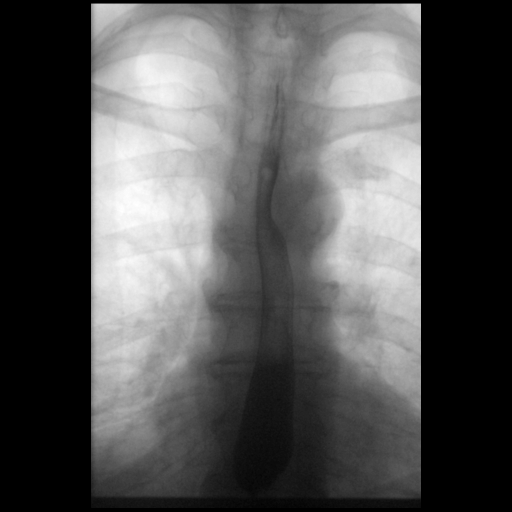
[frame 9/16]
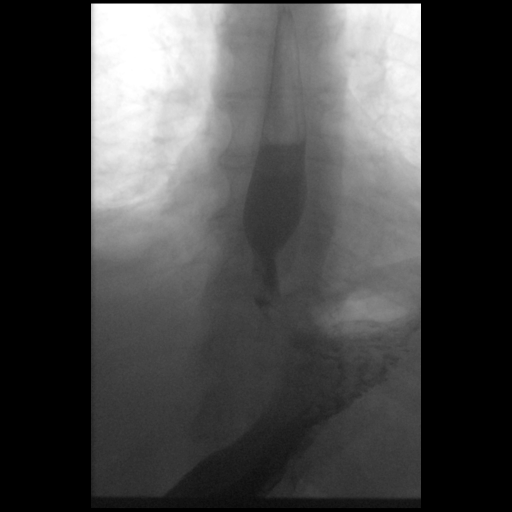
[frame 16/16]
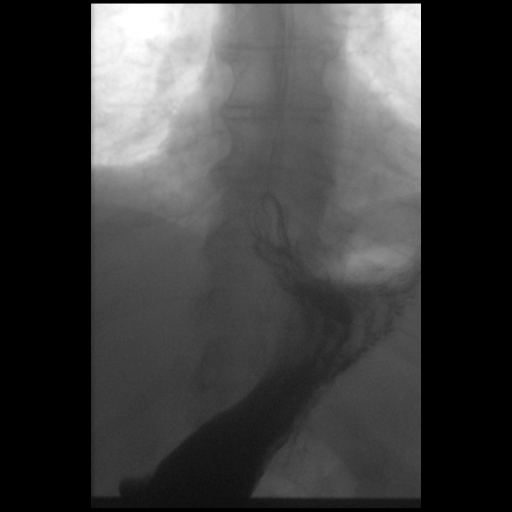

[17 of 24 positions shown; findings below may reference images not displayed]

FINDINGS: Lateral pharyngeal imaging shows good coordination with no
aspiration or obstructive process.

The esophagus has normal distensibility. Moderate sliding hiatal
hernia is present without stricture. There is history of Barrett's
esophagus, which is not definitively visualized today. Motility is
normal. A 13 mm barium tablet successfully reached the stomach.
IMPRESSION: 1. Moderate sliding hiatal hernia.
2. No stricture.
3. History of Barrett's esophagus that is often occult by
fluoroscopy.

## 2021-04-23 ENCOUNTER — Encounter: Payer: Self-pay | Admitting: Gastroenterology

## 2021-05-27 ENCOUNTER — Encounter: Payer: Medicare HMO | Admitting: Gastroenterology

## 2022-01-24 DIAGNOSIS — Z01818 Encounter for other preprocedural examination: Secondary | ICD-10-CM | POA: Diagnosis not present

## 2022-10-07 ENCOUNTER — Encounter: Payer: Self-pay | Admitting: Gastroenterology

## 2023-05-23 ENCOUNTER — Encounter: Payer: Self-pay | Admitting: Gastroenterology

## 2023-08-31 ENCOUNTER — Ambulatory Visit: Payer: Medicare HMO | Admitting: Gastroenterology

## 2023-09-20 ENCOUNTER — Other Ambulatory Visit: Payer: Self-pay

## 2023-09-20 NOTE — Progress Notes (Signed)
 Error

## 2023-09-22 NOTE — Progress Notes (Signed)
 Chief Complaint: positive fecal occult,anemia Primary GI Doctor:Dr. Venice Gillis  HPI: Patient is a 78 year old male patient with past medical history of CAD, GERD complicated with Barrett's, BCC, and hypertension,who was referred to me by Abbe Hoard., MD on 05/19/23 for a complaint of positive fecal occult and anemia.    Interval History     Patient has history of GERD complicated by Barrett's and currently taking pantoprazole  40 mg po daily. Denies GERD symptoms or dysphagia. Patient denies nausea, vomiting, or weight loss. Patient denies altered bowel habits, abdominal pain, or rectal bleeding. Denies fatigue, SOB, or chest pain. No alcohol use. Nonsmoker. He uses diclofenac twice daily and ibuprofen prn for arthritis. No significant family medical history.  Wt Readings from Last 3 Encounters:  09/25/23 195 lb (88.5 kg)  05/31/18 203 lb (92.1 kg)  05/02/18 203 lb 6 oz (92.3 kg)    Past Medical History:  Diagnosis Date   Allergy to alpha-gal    CAD (coronary artery disease)    Cancer (HCC)    basal cell carcinoma, left eyelid, ears   Chronic kidney disease    COPD, mild (HCC)    Enlarged prostate    GERD (gastroesophageal reflux disease)    History of Barrett's esophagus    History of hiatal hernia    History of kidney stones    Hypertension    Mixed hyperlipidemia    Osteoarthritis    PONV (postoperative nausea and vomiting)    Prediabetes    UTI (urinary tract infection)     Past Surgical History:  Procedure Laterality Date   COLONOSCOPY  09/17/2012   Small internal hemorrhoids. Otherwise normal colonoscopy to cecum.    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 Long segment Barretts esophagus. Moderate sized hiatal hernia.    kidney stone surgery     KNEE ARTHROSCOPY     LID LESION EXCISION Left 04/13/2017   Procedure: REMOVAL BIOPSY OF EYE LID LESION;  Surgeon: Karan Osgood, MD;   Location: [facility] OR;  Service: Ophthalmology;  Laterality: Left;   RECONSTRUCTION OF EYELID Left 04/13/2017   Procedure: TOTAL RECONSTRUCTION OF LOWER EYELID;  Surgeon: Karan Osgood, MD;  Location: [facility] OR;  Service: Ophthalmology;  Laterality: Left;   UPPER GASTROINTESTINAL ENDOSCOPY      Current Outpatient Medications  Medication Sig Dispense Refill   allopurinol (ZYLOPRIM) 300 MG tablet Take 300 mg by mouth every evening.     finasteride (PROSCAR) 5 MG tablet Take 5 mg by mouth daily.     lisinopril (PRINIVIL,ZESTRIL) 10 MG tablet Take 10 mg by mouth daily.     pantoprazole  (PROTONIX ) 40 MG tablet Take 1 tablet (40 mg total) by mouth 2 (two) times daily. 60 tablet 11   simvastatin (ZOCOR) 20 MG tablet Take 20 mg by mouth every evening.     aspirin EC 81 MG tablet Take 81 mg by mouth daily.  (Patient not taking: Reported on 09/25/2023)     esomeprazole (NEXIUM) 20 MG capsule Take 20 mg by mouth at bedtime as needed (heartburn). (Patient not taking: Reported on 09/25/2023)     Meth-Hyo-M Bl-Na Phos-Ph Sal (URIBEL) 118 MG CAPS Take 1 tablet by mouth as needed. (Patient not taking: Reported on 09/25/2023)     sulfamethoxazole-trimethoprim (BACTRIM DS) 800-160 MG tablet Take 1 tablet by mouth 2 (two) times daily. (Patient not taking: Reported on 09/25/2023)     No current facility-administered medications for this visit.  Allergies as of 09/25/2023   (No Known Allergies)    Family History  Problem Relation Age of Onset   CVA Mother    Heart attack Father    Colon cancer Neg Hx    Esophageal cancer Neg Hx    Colon polyps Neg Hx    Rectal cancer Neg Hx    Stomach cancer Neg Hx    Review of Systems:    Constitutional: No weight loss, fever, chills, weakness or fatigue HEENT: Eyes: No change in vision               Ears, Nose, Throat:  No change in hearing or congestion Skin: No rash or itching Cardiovascular: No chest pain, chest pressure or palpitations   Respiratory: No SOB or  cough Gastrointestinal: See HPI and otherwise negative Genitourinary: No dysuria or change in urinary frequency Neurological: No headache, dizziness or syncope Musculoskeletal: No new muscle or joint pain Hematologic: No bleeding or bruising Psychiatric: No history of depression or anxiety   Physical Exam:  Vital signs: BP 130/62 (BP Location: Left Arm, Patient Position: Sitting, Cuff Size: Normal)   Pulse 64   Ht 5\' 10"  (1.778 m)   Wt 195 lb (88.5 kg)   SpO2 96%   BMI 27.98 kg/m   Constitutional:  Pleasant Caucasian male appears to be in NAD, Well developed, Well nourished, alert and cooperative Throat: Oral cavity and pharynx without inflammation, swelling or lesion.  Respiratory: Respirations even and unlabored. Lungs clear to auscultation bilaterally.   No wheezes, crackles, or rhonchi.  Cardiovascular: Normal S1, S2. Regular rate and rhythm. No peripheral edema, cyanosis or pallor.  Gastrointestinal:  Soft, nondistended, nontender. No rebound or guarding. Normal bowel sounds. No appreciable masses or hepatomegaly. Rectal:  Not performed.  Msk:  Symmetrical without gross deformities. Without edema, no deformity or joint abnormality.  Skin:   Dry and intact without significant lesions or rashes. Psychiatric: Oriented to person, place and time. Demonstrates good judgement and reason without abnormal affect or behaviors.  RELEVANT LABS AND IMAGING: CBC    Latest Ref Rng & Units 04/13/2017    6:47 AM  CBC  WBC 4.0 - 10.5 K/uL 7.6   Hemoglobin 13.0 - 17.0 g/dL 16.1   Hematocrit 09.6 - 52.0 % 46.1   Platelets 150 - 400 K/uL 159     CMP     Latest Ref Rng & Units 04/13/2017    6:47 AM  CMP  Glucose 65 - 99 mg/dL 045   BUN 6 - 20 mg/dL 14   Creatinine 4.09 - 1.24 mg/dL 8.11   Sodium 914 - 782 mmol/L 139   Potassium 3.5 - 5.1 mmol/L 3.9   Chloride 101 - 111 mmol/L 111   CO2 22 - 32 mmol/L 21   Calcium 8.9 - 10.3 mg/dL 9.8   9/56/21 labs show: hgb 13.7, plt 169, iron 91,  ferritin 194, 05/16/23 IFOBT positive 05/31/18 EGD Impression:  - Benign- appearing esophageal stenosis. Dilated.  - Barrett' s esophagus. Biopsied.  - Medium- sized hiatal hernia.  - Mild Gastritis. Biopsied.  - The examination was otherwise normal. Path: 1. Surgical [P], gastric antrum and gastric body - BENIGN GASTRIC MUCOSA - NO H. PYLORI, INTESTINAL METAPLASIA OR MALIGNANCY IDENTIFIED 2. Surgical [P], GE junction - SQUAMOUS AND GLANDULAR EPITHELIUM WITH ACUTE AND CHRONIC INFLAMMATION - PROMINENT INTESTINAL METAPLASIA PRESENT - NO DYSPLASIA OR MALIGNANCY IDENTIFIED - SEE COMMENT  09/17/2012 colonoscopy Small internal hemorrhoids Otherwise normal colonoscopy to cecum.  Assessment: Encounter  Diagnoses  Name Primary?   Gastroesophageal reflux disease with esophagitis without hemorrhage Yes   Barrett's esophagus without dysplasia    Anemia, unspecified type    Positive fecal occult blood test        78 year old male patient who presents with positive fecal occult and anemia. His hemoglobin dropped from 14.4 (march) ---> 13.0 --> 13.7.  Patient has normal iron level and ferritin.  We did discuss stopping all NSAID use due to rick of stomach  ulcers and he takes pretty regularly. He is no longer on baby ASA. Will go ahead and schedule upper GI endoscopy and colonoscopy to evaluate for source of bleeding. He has history of barrett's that also needs surveillance screening and due for colon cancer screening as well.  Plan: -Continue pantoprazole  40mg  po daily -No NSAID's (stop ibuprofen and diclofenac) - talk to PCP about alternative - Schedule EGD in [facility] with Dr. Venice Gillis. The risks and benefits of EGD with possible biopsies and esophageal dilation were discussed with the patient who agrees to proceed.  - Schedule for a colonoscopy. The risks and benefits of colonoscopy with possible polypectomy / biopsies were discussed and the patient agrees to proceed.  -If negative exams, consider  small capsule to evaluate small bowel.   Thank you for the courtesy of this consult. Please call me with any questions or concerns.   Aubreanna Percle, FNP-C [facility] 09/25/2023, 11:00 AM  Cc: Abbe Hoard., MD

## 2023-09-25 ENCOUNTER — Other Ambulatory Visit: Payer: Self-pay

## 2023-09-25 ENCOUNTER — Ambulatory Visit (INDEPENDENT_AMBULATORY_CARE_PROVIDER_SITE_OTHER): Payer: Medicare HMO | Admitting: Gastroenterology

## 2023-09-25 ENCOUNTER — Encounter: Payer: Self-pay | Admitting: Gastroenterology

## 2023-09-25 VITALS — BP 130/62 | HR 64 | Ht 70.0 in | Wt 195.0 lb

## 2023-09-25 DIAGNOSIS — R195 Other fecal abnormalities: Secondary | ICD-10-CM | POA: Diagnosis not present

## 2023-09-25 DIAGNOSIS — D649 Anemia, unspecified: Secondary | ICD-10-CM

## 2023-09-25 DIAGNOSIS — K2100 Gastro-esophageal reflux disease with esophagitis, without bleeding: Secondary | ICD-10-CM | POA: Diagnosis not present

## 2023-09-25 DIAGNOSIS — K2270 Barrett's esophagus without dysplasia: Secondary | ICD-10-CM | POA: Diagnosis not present

## 2023-09-25 MED ORDER — SUTAB 1479-225-188 MG PO TABS
ORAL_TABLET | ORAL | 0 refills | Status: DC
Start: 2023-09-25 — End: 2023-11-24

## 2023-09-25 NOTE — Patient Instructions (Signed)
 Continue Pantoprazole  40mg   You have been scheduled for a colonoscopy. Please follow written instructions given to you at your visit today.   If you use inhalers (even only as needed), please bring them with you on the day of your procedure.  DO NOT TAKE 7 DAYS PRIOR TO TEST- Trulicity (dulaglutide) Ozempic, Wegovy (semaglutide) Mounjaro (tirzepatide) Bydureon Bcise (exanatide extended release)  DO NOT TAKE 1 DAY PRIOR TO YOUR TEST Rybelsus (semaglutide) Adlyxin (lixisenatide) Victoza (liraglutide) Byetta (exanatide) ___________________________________________________________________________  Ryan Zamora will receive your bowel preparation through [facility], which ensures the lowest copay and home delivery, with outreach via text or call from an [phone] number. Please respond promptly to avoid rescheduling of your procedure. If you are interested in alternative options or have any questions regarding your prep, please contact them at [phone] ____________________________________________________________________________  Your Provider Has Sent Your Bowel Prep Regimen To [facility]   [facility] will contact you to verify your information and collect your copay, if applicable. Enjoy the comfort of your home while your prescription is mailed to you, FREE of any shipping charges.   [facility] accepts all major insurance benefits and applies discounts & coupons.  Have additional questions?   Chat: [email or web address] Call: [phone] Email: [email or web address] NCPDP: [id number]  How will [facility] contact you?  With a Welcome phone call,  a Welcome text and a checkout link in text form.  Texts you receive from [phone] Are NOT Spam.  *To set up delivery, you must complete the checkout process via link or speak to one of the patient care representatives. If [facility] is unable to reach you, your prescription may be delayed.  To avoid long hold times on the phone, you  may also utilize the secure chat feature on the [facility] website to request that they call you back for transaction completion or to expedite your concerns.  _______________________________________________________  If your blood pressure at your visit was 140/90 or greater, please contact your primary care physician to follow up on this.  _______________________________________________________  If you are age 91 or older, your body mass index should be between 23-30. Your Body mass index is 27.98 kg/m. If this is out of the aforementioned range listed, please consider follow up with your Primary Care Provider.  If you are age 87 or younger, your body mass index should be between 19-25. Your Body mass index is 27.98 kg/m. If this is out of the aformentioned range listed, please consider follow up with your Primary Care Provider.   ________________________________________________________  The [facility] providers would like to encourage you to use MYCHART to communicate with providers for non-urgent requests or questions.  Due to long hold times on the telephone, sending your provider a message by [facility] may be a faster and more efficient way to get a response.  Please allow 48 business hours for a response.  Please remember that this is for non-urgent requests.  _______________________________________________________  Thank you for trusting me with your gastrointestinal care!   Deanna May, NP

## 2023-11-16 ENCOUNTER — Encounter: Payer: Self-pay | Admitting: Gastroenterology

## 2023-11-24 ENCOUNTER — Ambulatory Visit: Payer: Medicare HMO | Admitting: Gastroenterology

## 2023-11-24 ENCOUNTER — Encounter: Payer: Self-pay | Admitting: Gastroenterology

## 2023-11-24 ENCOUNTER — Other Ambulatory Visit: Payer: Self-pay | Admitting: Gastroenterology

## 2023-11-24 VITALS — BP 105/57 | HR 65 | Temp 97.7°F | Resp 15 | Ht 70.0 in | Wt 195.0 lb

## 2023-11-24 DIAGNOSIS — K640 First degree hemorrhoids: Secondary | ICD-10-CM

## 2023-11-24 DIAGNOSIS — K449 Diaphragmatic hernia without obstruction or gangrene: Secondary | ICD-10-CM

## 2023-11-24 DIAGNOSIS — D124 Benign neoplasm of descending colon: Secondary | ICD-10-CM

## 2023-11-24 DIAGNOSIS — D649 Anemia, unspecified: Secondary | ICD-10-CM

## 2023-11-24 DIAGNOSIS — K22711 Barrett's esophagus with high grade dysplasia: Secondary | ICD-10-CM | POA: Diagnosis not present

## 2023-11-24 DIAGNOSIS — K22710 Barrett's esophagus with low grade dysplasia: Secondary | ICD-10-CM | POA: Diagnosis not present

## 2023-11-24 DIAGNOSIS — K2270 Barrett's esophagus without dysplasia: Secondary | ICD-10-CM

## 2023-11-24 DIAGNOSIS — K5730 Diverticulosis of large intestine without perforation or abscess without bleeding: Secondary | ICD-10-CM

## 2023-11-24 DIAGNOSIS — K319 Disease of stomach and duodenum, unspecified: Secondary | ICD-10-CM | POA: Diagnosis not present

## 2023-11-24 DIAGNOSIS — K635 Polyp of colon: Secondary | ICD-10-CM

## 2023-11-24 DIAGNOSIS — K2100 Gastro-esophageal reflux disease with esophagitis, without bleeding: Secondary | ICD-10-CM

## 2023-11-24 DIAGNOSIS — Z1211 Encounter for screening for malignant neoplasm of colon: Secondary | ICD-10-CM | POA: Diagnosis present

## 2023-11-24 MED ORDER — SODIUM CHLORIDE 0.9 % IV SOLN: 500.0000 mL | INTRAVENOUS | Status: DC

## 2023-11-24 NOTE — Progress Notes (Signed)
 Chief Complaint: positive fecal occult,anemia Primary GI Doctor:Dr. Chales Abrahams   HPI: Patient is a 78 year old male patient with past medical history of CAD, GERD complicated with Barrett's, BCC, and hypertension,who was referred to me by Gordan Payment., MD on 05/19/23 for a complaint of positive fecal occult and anemia.     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 Denies GERD symptoms or dysphagia. Patient denies nausea, vomiting, or weight loss. Patient denies altered bowel habits, abdominal pain, or rectal bleeding. Denies fatigue, SOB, or chest pain. No alcohol use. Nonsmoker. He uses diclofenac twice daily and ibuprofen prn for arthritis. No significant family medical history.      Wt Readings from Last 3 Encounters:  09/25/23 195 lb (88.5 kg)  05/31/18 203 lb (92.1 kg)  05/02/18 203 lb 6 oz (92.3 kg)        Past Medical History:  Diagnosis Date   Allergy to alpha-gal     CAD (coronary artery disease)     Cancer (HCC)      basal cell carcinoma, left eyelid, ears   Chronic kidney disease     COPD, mild (HCC)     Enlarged prostate     GERD (gastroesophageal reflux disease)     History of Barrett's esophagus     History of hiatal hernia     History of kidney stones     Hypertension     Mixed hyperlipidemia     Osteoarthritis     PONV (postoperative nausea and vomiting)     Prediabetes     UTI (urinary tract infection)                 Past Surgical History:  Procedure Laterality Date   COLONOSCOPY   09/17/2012    Small internal hemorrhoids. Otherwise normal colonoscopy to cecum.    ESOPHAGOGASTRODUODENOSCOPY   09/17/2012    High esophageal stricture (Barretts stricture) status post esophageal dilatation. Long segment Barretts esophagus. Moderate sized hiatal hernia.    kidney stone surgery       KNEE ARTHROSCOPY       LID LESION EXCISION Left 04/13/2017    Procedure: REMOVAL BIOPSY OF  EYE LID LESION;  Surgeon: Floydene Flock, MD;  Location: [facility] OR;  Service: Ophthalmology;  Laterality: Left;   RECONSTRUCTION OF EYELID Left 04/13/2017    Procedure: TOTAL RECONSTRUCTION OF LOWER EYELID;  Surgeon: Floydene Flock, MD;  Location: [facility] OR;  Service: Ophthalmology;  Laterality: Left;   UPPER GASTROINTESTINAL ENDOSCOPY                    Current Outpatient Medications  Medication Sig Dispense Refill   allopurinol (ZYLOPRIM) 300 MG tablet Take 300 mg by mouth every evening.       finasteride (PROSCAR) 5 MG tablet Take 5 mg by mouth daily.       lisinopril (PRINIVIL,ZESTRIL) 10 MG tablet Take 10 mg by mouth daily.       pantoprazole (PROTONIX) 40 MG tablet Take 1 tablet (40 mg total) by mouth 2 (two) times daily. 60 tablet 11   simvastatin (ZOCOR) 20 MG tablet Take 20 mg by mouth every evening.       aspirin EC 81 MG tablet Take 81 mg by mouth daily.  (Patient not taking: Reported on 09/25/2023)       esomeprazole (NEXIUM) 20 MG capsule Take 20 mg by  mouth at bedtime as needed (heartburn). (Patient not taking: Reported on 09/25/2023)       Meth-Hyo-M Bl-Na Phos-Ph Sal (URIBEL) 118 MG CAPS Take 1 tablet by mouth as needed. (Patient not taking: Reported on 09/25/2023)       sulfamethoxazole-trimethoprim (BACTRIM DS) 800-160 MG tablet Take 1 tablet by mouth 2 (two) times daily. (Patient not taking: Reported on 09/25/2023)          No current facility-administered medications for this visit.           Allergies as of 09/25/2023   (No Known Allergies)           Family History  Problem Relation Age of Onset   CVA Mother     Heart attack Father     Colon cancer Neg Hx     Esophageal cancer Neg Hx     Colon polyps Neg Hx     Rectal cancer Neg Hx     Stomach cancer Neg Hx          Review of Systems:    Constitutional: No weight loss, fever, chills, weakness or fatigue HEENT: Eyes: No change in vision               Ears, Nose, Throat:  No change in hearing or  congestion Skin: No rash or itching Cardiovascular: No chest pain, chest pressure or palpitations   Respiratory: No SOB or cough Gastrointestinal: See HPI and otherwise negative Genitourinary: No dysuria or change in urinary frequency Neurological: No headache, dizziness or syncope Musculoskeletal: No new muscle or joint pain Hematologic: No bleeding or bruising Psychiatric: No history of depression or anxiety    Physical Exam:  Vital signs: BP 130/62 (BP Location: Left Arm, Patient Position: Sitting, Cuff Size: Normal)   Pulse 64   Ht 5\' 10"  (1.778 m)   Wt 195 lb (88.5 kg)   SpO2 96%   BMI 27.98 kg/m    Constitutional:  Pleasant Caucasian male appears to be in NAD, Well developed, Well nourished, alert and cooperative Throat: Oral cavity and pharynx without inflammation, swelling or lesion.  Respiratory: Respirations even and unlabored. Lungs clear to auscultation bilaterally.   No wheezes, crackles, or rhonchi.  Cardiovascular: Normal S1, S2. Regular rate and rhythm. No peripheral edema, cyanosis or pallor.  Gastrointestinal:  Soft, nondistended, nontender. No rebound or guarding. Normal bowel sounds. No appreciable masses or hepatomegaly. Rectal:  Not performed.  Msk:  Symmetrical without gross deformities. Without edema, no deformity or joint abnormality.  Skin:   Dry and intact without significant lesions or rashes. Psychiatric: Oriented to person, place and time. Demonstrates good judgement and reason without abnormal affect or behaviors.   RELEVANT LABS AND IMAGING: CBC     Latest Ref Rng & Units 04/13/2017    6:47 AM  CBC  WBC 4.0 - 10.5 K/uL 7.6   Hemoglobin 13.0 - 17.0 g/dL 16.1   Hematocrit 09.6 - 52.0 % 46.1   Platelets 150 - 400 K/uL 159     CMP         Latest Ref Rng & Units 04/13/2017    6:47 AM  CMP  Glucose 65 - 99 mg/dL 045   BUN 6 - 20 mg/dL 14   Creatinine 4.09 - 1.24 mg/dL 8.11   Sodium 914 - 782 mmol/L 139   Potassium 3.5 - 5.1 mmol/L 3.9    Chloride 101 - 111 mmol/L 111   CO2 22 - 32 mmol/L 21   Calcium 8.9 -  10.3 mg/dL 9.8   12/07/93 labs show: hgb 13.7, plt 169, iron 91, ferritin 194, 05/16/23 IFOBT positive 05/31/18 EGD Impression:  - Benign- appearing esophageal stenosis. Dilated.  - Barrett' s esophagus. Biopsied.  - Medium- sized hiatal hernia.  - Mild Gastritis. Biopsied.  - The examination was otherwise normal. Path: 1. Surgical [P], gastric antrum and gastric body - BENIGN GASTRIC MUCOSA - NO H. PYLORI, INTESTINAL METAPLASIA OR MALIGNANCY IDENTIFIED 2. Surgical [P], GE junction - SQUAMOUS AND GLANDULAR EPITHELIUM WITH ACUTE AND CHRONIC INFLAMMATION - PROMINENT INTESTINAL METAPLASIA PRESENT - NO DYSPLASIA OR MALIGNANCY IDENTIFIED - SEE COMMENT   09/17/2012 colonoscopy Small internal hemorrhoids Otherwise normal colonoscopy to cecum.   Assessment:     Encounter Diagnoses  Name Primary?   Gastroesophageal reflux disease with esophagitis without hemorrhage Yes   Barrett's esophagus without dysplasia     Anemia, unspecified type     Positive fecal occult blood test         78 year old male patient who presents with positive fecal occult and anemia. His hemoglobin dropped from 14.4 (march) ---> 13.0 --> 13.7.  Patient has normal iron level and ferritin.  We did discuss stopping all NSAID use due to rick of stomach  ulcers and he takes pretty regularly. He is no longer on baby ASA. Will go ahead and schedule upper GI endoscopy and colonoscopy to evaluate for source of bleeding. He has history of barrett's that also needs surveillance screening and due for colon cancer screening as well.   Plan: -Continue pantoprazole 40mg  po daily -No NSAID's (stop ibuprofen and diclofenac) - talk to PCP about alternative - Schedule EGD in [facility] with Dr. Chales Abrahams. The risks and benefits of EGD with possible biopsies and esophageal dilation were discussed with the patient who agrees to proceed.  - Schedule for a colonoscopy. The risks  and benefits of colonoscopy with possible polypectomy / biopsies were discussed and the patient agrees to proceed.  -If negative exams, consider small capsule to evaluate small bowel.   Thank you for the courtesy of this consult. Please call me with any questions or concerns.    Deanna May, FNP-C [facility]    Attending physician's note   I have taken history, reviewed the chart and examined the patient. I performed a substantive portion of this encounter, including complete performance of at least one of the key components, in conjunction with the APP. I agree with the Advanced Practitioner's note, impression and recommendations.    Edman Circle, MD [facility] [phone]

## 2023-11-24 NOTE — Op Note (Signed)
 [facility] Patient Name: Ryan Zamora Procedure Date: 11/24/2023 3:41 PM MRN: [id number] Endoscopist: Lynann Bologna , MD, [id number] Age: 78 Referring MD:  Date of Birth: 06-Nov-1945 Gender: Male Account #: [id number] Procedure:                Upper GI endoscopy Indications:              H/O Barrett's esophagus. History of anemia. Medicines:                Monitored Anesthesia Care Procedure:                Pre-Anesthesia Assessment:                           - Prior to the procedure, a History and Physical                            was performed, and patient medications and                            allergies were reviewed. The patient's tolerance of                            previous anesthesia was also reviewed. The risks                            and benefits of the procedure and the sedation                            options and risks were discussed with the patient.                            All questions were answered, and informed consent                            was obtained. Prior Anticoagulants: The patient has                            taken no anticoagulant or antiplatelet agents. ASA                            Grade Assessment: II - A patient with mild systemic                            disease. After reviewing the risks and benefits,                            the patient was deemed in satisfactory condition to                            undergo the procedure.                           After obtaining informed consent, the endoscope was  passed under direct vision. Throughout the                            procedure, the patient's blood pressure, pulse, and                            oxygen saturations were monitored continuously. The                            Olympus Scope SN [id number] was introduced through the                            mouth, and advanced to the second part of duodenum.                            The upper  GI endoscopy was accomplished without                            difficulty. The patient tolerated the procedure                            well. Scope In: Scope Out: Findings:                 There were esophageal mucosal changes consistent                            with long-segment Barrett's esophagus present in                            the mid esophagus and in the distal esophagus. The                            maximum longitudinal extent of these mucosal                            changes was 10 cm in length (extending from 25 up                            to 35 cm). Mucosa was biopsied with a cold forceps                            for histology in 4 quadrants at intervals of 1.5 cm.                           A 5 cm hiatal hernia was present. This extended                            from 35 to 40 cm (GE junction up to diaphragmatic                            hiatus). GE junction was Hill's grade 4.  Localized mild inflammation characterized by                            erythema and friability was found in the gastric                            antrum. Biopsies were taken with a cold forceps for                            histology.                           The examined duodenum was normal. Biopsies for                            histology were taken with a cold forceps for                            evaluation of celiac disease. Complications:            No immediate complications. Estimated Blood Loss:     Estimated blood loss: none. Impression:               - Esophageal mucosal changes consistent with                            long-segment Barrett's esophagus. Biopsied.                           - 5 cm hiatal hernia.                           - Gastritis. Biopsied.                           - Normal examined duodenum. Biopsied. Recommendation:           - Patient has a contact number available for                            emergencies. The signs  and symptoms of potential                            delayed complications were discussed with the                            patient. Return to normal activities tomorrow.                            Written discharge instructions were provided to the                            patient.                           - Resume previous diet.                           -  Continue present medications including Protonix                            40 mg p.o. daily.                           - Await pathology results.                           - Avoid ibuprofen, naproxen, or other non-steroidal                            anti-inflammatory drugs.                           - Proceed with colonoscopy.                           - The findings and recommendations were discussed                            with the patient's family. Lynann Bologna, MD 11/24/2023 4:14:00 PM This report has been signed electronically.

## 2023-11-24 NOTE — Progress Notes (Signed)
 Pt's states no medical or surgical changes since previsit or office visit.

## 2023-11-24 NOTE — Op Note (Signed)
 [facility] Patient Name: Noa Constante Procedure Date: 11/24/2023 3:40 PM MRN: [id number] Endoscopist: Lynann Bologna , MD, [id number] Age: 78 Referring MD:  Date of Birth: 1945/11/27 Gender: Male Account #: [id number] Procedure:                Colonoscopy Indications:              Screening for colorectal malignant neoplasm Medicines:                Monitored Anesthesia Care Procedure:                Pre-Anesthesia Assessment:                           - Prior to the procedure, a History and Physical                            was performed, and patient medications and                            allergies were reviewed. The patient's tolerance of                            previous anesthesia was also reviewed. The risks                            and benefits of the procedure and the sedation                            options and risks were discussed with the patient.                            All questions were answered, and informed consent                            was obtained. Prior Anticoagulants: The patient has                            taken no anticoagulant or antiplatelet agents. ASA                            Grade Assessment: II - A patient with mild systemic                            disease. After reviewing the risks and benefits,                            the patient was deemed in satisfactory condition to                            undergo the procedure.                           After obtaining informed consent, the colonoscope  was passed under direct vision. Throughout the                            procedure, the patient's blood pressure, pulse, and                            oxygen saturations were monitored continuously. The                            CF [id number] #[id number] was introduced through the anus                            and advanced to the 2 cm into the ileum. The                            colonoscopy was  performed without difficulty. The                            patient tolerated the procedure well. The quality                            of the bowel preparation was good. The terminal                            ileum, ileocecal valve, appendiceal orifice, and                            rectum were photographed. Scope In: 3:59:20 PM Scope Out: 4:10:06 PM Scope Withdrawal Time: 0 hours 8 minutes 17 seconds  Total Procedure Duration: 0 hours 10 minutes 46 seconds  Findings:                 Two sessile polyps were found in the mid descending                            colon and distal descending colon. The polyps were                            4 to 6 mm in size. These polyps were removed with a                            cold snare. Resection and retrieval were complete.                           A few medium-mouthed diverticula were found in the                            sigmoid colon.                           Non-bleeding internal hemorrhoids were found during                            retroflexion. The hemorrhoids were  small and Grade                            I (internal hemorrhoids that do not prolapse).                           The terminal ileum appeared normal.                           The exam was otherwise without abnormality on                            direct and retroflexion views. Complications:            No immediate complications. Estimated Blood Loss:     Estimated blood loss: none. Impression:               - Two 4 to 6 mm polyps in the mid descending colon                            and in the distal descending colon, removed with a                            cold snare. Resected and retrieved.                           - Mild sigmoid diverticulosis.                           - Non-bleeding internal hemorrhoids.                           - The examined portion of the ileum was normal.                           - The examination was otherwise normal on direct                             and retroflexion views. Recommendation:           - Patient has a contact number available for                            emergencies. The signs and symptoms of potential                            delayed complications were discussed with the                            patient. Return to normal activities tomorrow.                            Written discharge instructions were provided to the                            patient.                           -  Resume previous diet.                           - Continue present medications.                           - Await pathology results.                           - Repeat colonoscopy is not recommended for                            screening purposes.                           - FU GI clinic on as needed basis.                           - The findings and recommendations were discussed                            with the patient's family. Lynann Bologna, MD 11/24/2023 4:16:39 PM This report has been signed electronically.

## 2023-11-24 NOTE — Patient Instructions (Signed)
 -Handout on hiatal hernia, polyps, hemorrhoids and diverticulosis provided -await pathology results -repeat colonoscopy for surveillance recommended. Date to be determined when pathology result become available   -Continue present medications  -Avoid ibuprofen, naproxen or other non-steroidal anti-inflammatory drugs   YOU HAD AN ENDOSCOPIC PROCEDURE TODAY AT THE [facility]:   Refer to the procedure report that was given to you for any specific questions about what was found during the examination.  If the procedure report does not answer your questions, please call your gastroenterologist to clarify.  If you requested that your care partner not be given the details of your procedure findings, then the procedure report has been included in a sealed envelope for you to review at your convenience later.  YOU SHOULD EXPECT: Some feelings of bloating in the abdomen. Passage of more gas than usual.  Walking can help get rid of the air that was put into your GI tract during the procedure and reduce the bloating. If you had a lower endoscopy (such as a colonoscopy or flexible sigmoidoscopy) you may notice spotting of blood in your stool or on the toilet paper. If you underwent a bowel prep for your procedure, you may not have a normal bowel movement for a few days.  Please Note:  You might notice some irritation and congestion in your nose or some drainage.  This is from the oxygen used during your procedure.  There is no need for concern and it should clear up in a day or so.  SYMPTOMS TO REPORT IMMEDIATELY:  Following lower endoscopy (colonoscopy or flexible sigmoidoscopy):  Excessive amounts of blood in the stool  Significant tenderness or worsening of abdominal pains  Swelling of the abdomen that is new, acute  Fever of 100F or higher  Following upper endoscopy (EGD)  Vomiting of blood or coffee ground material  New chest pain or pain under the shoulder blades  Painful or  persistently difficult swallowing  New shortness of breath  Fever of 100F or higher  Black, tarry-looking stools  For urgent or emergent issues, a gastroenterologist can be reached at any hour by calling [phone]. Do not use MyChart messaging for urgent concerns.    DIET:  We do recommend a small meal at first, but then you may proceed to your regular diet.  Drink plenty of fluids but you should avoid alcoholic beverages for 24 hours.  ACTIVITY:  You should plan to take it easy for the rest of today and you should NOT DRIVE or use heavy machinery until tomorrow (because of the sedation medicines used during the test).    FOLLOW UP: Our staff will call the number listed on your records the next business day following your procedure.  We will call around 7:15- 8:00 am to check on you and address any questions or concerns that you may have regarding the information given to you following your procedure. If we do not reach you, we will leave a message.     If any biopsies were taken you will be contacted by phone or by letter within the next 1-3 weeks.  Please call us at [phone] if you have not heard about the biopsies in 3 weeks.    SIGNATURES/CONFIDENTIALITY: You and/or your care partner have signed paperwork which will be entered into your electronic medical record.  These signatures attest to the fact that that the information above on your After Visit Summary has been reviewed and is understood.  Full responsibility of the  confidentiality of this discharge information lies with you and/or your care-partner.   Hiatal Hernia  A hiatal hernia occurs when part of the stomach slides above the muscle that separates the abdomen from the chest (diaphragm). A person can be born with a hiatal hernia (congenital), or it may develop over time. In almost all cases of hiatal hernia, only the top part of the stomach pushes through the diaphragm. Many people have a hiatal hernia with no  symptoms. The larger the hernia, the more likely it is that you will have symptoms. In some cases, a hiatal hernia allows stomach acid to flow back into the tube that carries food from your mouth to your stomach (esophagus). This may cause heartburn symptoms. The development of heartburn symptoms may mean that you have a condition called gastroesophageal reflux disease (GERD). What are the causes? This condition is caused by a weakness in the opening (hiatus) where the esophagus passes through the diaphragm to attach to the upper part of the stomach. A person may be born with a weakness in the hiatus, or a weakness can develop over time. What increases the risk? This condition is more likely to develop in: Older people. Age is a major risk factor for a hiatal hernia, especially if you are over the age of 52. Pregnant women. People who are overweight. People who have frequent constipation. What are the signs or symptoms? Symptoms of this condition usually develop in the form of GERD symptoms. Symptoms include: Heartburn. Upset stomach (indigestion). Trouble swallowing. Coughing or wheezing. Wheezing is making high-pitched whistling sounds when you breathe. Sore throat. Chest pain. Nausea and vomiting. How is this diagnosed? This condition may be diagnosed during testing for GERD. Tests that may be done include: X-rays of your stomach or chest. An upper gastrointestinal (GI) series. This is an X-ray exam of your GI tract that is taken after you swallow a chalky liquid that shows up clearly on the X-ray. Endoscopy. This is a procedure to look into your stomach using a thin, flexible tube that has a tiny camera and light on the end of it. How is this treated? This condition may be treated by: Dietary and lifestyle changes to help reduce GERD symptoms. Medicines. These may include: Over-the-counter antacids. Medicines that make your stomach empty more quickly. Medicines that block the  production of stomach acid (H2 blockers). Stronger medicines to reduce stomach acid (proton pump inhibitors). Surgery to repair the hernia, if other treatments are not helping. If you have no symptoms, you may not need treatment. Follow these instructions at home: Lifestyle and activity Do not use any products that contain nicotine or tobacco. These products include cigarettes, chewing tobacco, and vaping devices, such as e-cigarettes. If you need help quitting, ask your health care provider. Try to achieve and maintain a healthy body weight. Avoid putting pressure on your abdomen. Anything that puts pressure on your abdomen increases the amount of acid that may be pushed up into your esophagus. Avoid bending over, especially after eating. Raise the head of your bed by putting blocks under the legs. This keeps your head and esophagus higher than your stomach. Do not wear tight clothing around your chest or stomach. Try not to strain when having a bowel movement, when urinating, or when lifting heavy objects. Eating and drinking Avoid foods that can worsen GERD symptoms. These may include: Fatty foods, like fried foods. Citrus fruits, like oranges or lemon. Other foods and drinks that contain acid, like orange juice or  tomatoes. Spicy food. Chocolate. Eat frequent small meals instead of three large meals a day. This helps prevent your stomach from getting too full. Eat slowly. Do not lie down right after eating. Do not eat 1-2 hours before bed. Do not drink beverages with caffeine. These include cola, coffee, cocoa, and tea. Do not drink alcohol. General instructions Take over-the-counter and prescription medicines only as told by your health care provider. Keep all follow-up visits. Your health care provider will want to check that any new prescribed medicines are helping your symptoms. Contact a health care provider if: Your symptoms are not controlled with medicines or lifestyle  changes. You are having trouble swallowing. You have coughing or wheezing that will not go away. Your pain is getting worse. Your pain spreads to your arms, neck, jaw, teeth, or back. You feel nauseous or you vomit. Get help right away if: You have shortness of breath. You vomit blood. You have bright red blood in your stools. You have black, tarry stools. These symptoms may be an emergency. Get help right away. Call 911. Do not wait to see if the symptoms will go away. Do not drive yourself to the hospital. Summary A hiatal hernia occurs when part of the stomach slides above the muscle that separates the abdomen from the chest. A person may be born with a weakness in the hiatus, or a weakness can develop over time. Symptoms of a hiatal hernia may include heartburn, trouble swallowing, or sore throat. Management of a hiatal hernia includes eating frequent small meals instead of three large meals a day. Get help right away if you vomit blood, have bright red blood in your stools, or have black, tarry stools. This information is not intended to replace advice given to you by your health care provider. Make sure you discuss any questions you have with your health care provider. Document Revised: 09/28/2021 Document Reviewed: 09/28/2021 Elsevier Patient Education  2024 [employer].

## 2023-11-24 NOTE — Progress Notes (Signed)
 To pacu, VSS. Report to [email or web address]

## 2023-11-24 NOTE — Progress Notes (Signed)
 Called to room to assist during endoscopic procedure.  Patient ID and intended procedure confirmed with present staff. Received instructions for my participation in the procedure from the performing physician.

## 2023-11-28 ENCOUNTER — Telehealth: Payer: Self-pay | Admitting: *Deleted

## 2023-11-28 NOTE — Telephone Encounter (Signed)
 Post procedure follow up call placed, no answer and left VM.

## 2023-11-30 LAB — SURGICAL PATHOLOGY

## 2023-12-20 ENCOUNTER — Telehealth: Payer: Self-pay

## 2023-12-20 ENCOUNTER — Other Ambulatory Visit: Payer: Self-pay

## 2023-12-20 DIAGNOSIS — K2270 Barrett's esophagus without dysplasia: Secondary | ICD-10-CM

## 2023-12-20 NOTE — Telephone Encounter (Signed)
 EGD FNA has been set up for 02/29/24 at 130 pm at [facility] with GM

## 2023-12-20 NOTE — Telephone Encounter (Signed)
-----   Message from [facility] sent at 12/17/2023  7:23 AM EDT ----- Regarding: RE: Nonurgent advice RG, Sounds good.  Claus Silvestro, Please work on scheduling this patient for July EGD with RFA 75-minute case (due to extensive Barrett's). I would like to see the patient and wife so that we can go over the protocol in detail so they understand it fully.  Please work on setting up that clinic visit as able (okay to use overbook or held slot or 3:50 PM slot). Thanks. GM ----- Message ----- From: Lajuan Pila, MD Sent: 12/15/2023   4:48 PM EDT To: Normie Becton., MD Subject: RE: Nonurgent advice                           Gabe, Discussed with wife in detail OK to proceed with RFA with you in mid-July/Aug. Pt/family out of town for July 4th week. On protonix  40 BID Has stopped bASA (d/t bruising)  Thanks as always for your help.  Farrell Honey ----- Message ----- From: Normie Becton., MD Sent: 12/15/2023   1:23 PM EDT To: Lajuan Pila, MD Subject: RE: Nonurgent advice                           RG, That is quite a long length of Barrett's. He would need likely multiple ablations with a 360 RFA. 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 Also all the biopsies were taken and it is a little bit more difficult to ascertain where the HGD may be. I am scheduling into July at this point for procedures. If you are okay for July or August procedure we can set him up to be seen and discussed the protocol. Otherwise [facility] if he needs to be seen sooner. I can talk with him about if he wants to be aggressive and consider ablation due to the single time finding of HGD versus repeating an upper endoscopy with the biopsies as you have done but placing them in separate jars so I can better delineate where the HGD may be. You know him well, how aggressive do  think he wants to be? Let me know what you would like to do. Thanks. 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

## 2023-12-21 NOTE — Telephone Encounter (Signed)
 6/13 appt with GM made   EGD and ROV scheduled, pt instructed and medications reviewed.  Patient instructions mailed to home.  Patient to call with any questions or concerns.

## 2023-12-21 NOTE — Telephone Encounter (Signed)
 Left message on machine to call back

## 2024-01-26 ENCOUNTER — Encounter: Payer: Self-pay | Admitting: Gastroenterology

## 2024-01-26 ENCOUNTER — Ambulatory Visit: Admitting: Gastroenterology

## 2024-01-26 VITALS — BP 122/60 | HR 56 | Ht 70.0 in | Wt 187.2 lb

## 2024-01-26 DIAGNOSIS — K22710 Barrett's esophagus with low grade dysplasia: Secondary | ICD-10-CM

## 2024-01-26 DIAGNOSIS — K22711 Barrett's esophagus with high grade dysplasia: Secondary | ICD-10-CM

## 2024-01-26 DIAGNOSIS — K219 Gastro-esophageal reflux disease without esophagitis: Secondary | ICD-10-CM | POA: Diagnosis not present

## 2024-01-26 DIAGNOSIS — R933 Abnormal findings on diagnostic imaging of other parts of digestive tract: Secondary | ICD-10-CM | POA: Diagnosis not present

## 2024-01-26 NOTE — Progress Notes (Unsigned)
 GASTROENTEROLOGY OUTPATIENT CLINIC VISIT   Primary Care Provider Abbe Hoard., MD [address] [place] [address] [phone]  Referring Provider Abbe Hoard., MD [address] [place],  [place] [address]   Patient Profile: Ryan Zamora is a 78 y.o. male with a pmh significant for  The patient presents to the [facility] for an evaluation and management of problem(s) noted below:  Problem List No diagnosis found.  History of Present Illness    The patient does/does not take NSAIDs or BC/Goody Powder. Patient has/has not had an EGD. Patient has/has not had a Colonoscopy.  GI Review of Systems Positive as above Negative for  Pyrosis; Reflux; Regurgitation; Dysphagia; Odynophagia; Globus; Post-prandial cough; Nocturnal cough; Nasal regurgitation; Epigastric pain; Nausea; Vomiting; Hematemesis; Jaundice; Change in Appetite; Early satiety; Abdominal pain; Abdominal bloating; Eructation; Flatulence; Change in BM Frequency; Change in BM Consistency; Constipation; Diarrhea; Incontinence; Urgency; Tenesmus; Hematochezia; Melena  Review of Systems General: Denies fevers/chills/weight loss unintentionally Cardiovascular: Denies chest pain Pulmonary: Denies shortness of breath Gastroenterological: See HPI Genitourinary: Denies darkened urine Hematological: Denies easy bruising/bleeding Endocrine: Denies temperature intolerance Dermatological: Denies jaundice Psychological: Mood is stable  Medications Current Outpatient Medications  Medication Sig Dispense Refill   alfuzosin (UROXATRAL) 10 MG 24 hr tablet Take 10 mg by mouth daily.     allopurinol (ZYLOPRIM) 300 MG tablet Take 300 mg by mouth every evening.     cyanocobalamin (VITAMIN B12) 1000 MCG tablet Take 1 tablet by mouth daily.     diclofenac (VOLTAREN) 75 MG EC tablet Take 75 mg by mouth 2 (two) times daily.     finasteride (PROSCAR) 5 MG tablet Take 5 mg by mouth daily.      lisinopril (PRINIVIL,ZESTRIL) 10 MG tablet Take 10 mg by mouth daily.     Meth-Hyo-M Bl-Na Phos-Ph Sal (URIBEL) 118 MG CAPS Take 1 tablet by mouth as needed.     metoprolol succinate (TOPROL-XL) 25 MG 24 hr tablet Take 25 mg by mouth 2 (two) times daily.     pantoprazole  (PROTONIX ) 40 MG tablet Take 1 tablet (40 mg total) by mouth 2 (two) times daily. 60 tablet 11   simvastatin (ZOCOR) 20 MG tablet Take 20 mg by mouth every evening.     sulfamethoxazole-trimethoprim (BACTRIM DS) 800-160 MG tablet Take 1 tablet by mouth 2 (two) times daily.     aspirin EC 81 MG tablet Take 81 mg by mouth daily.  (Patient not taking: Reported on 09/25/2023)     erythromycin ophthalmic ointment 3 (three) times daily.     No current facility-administered medications for this visit.    Allergies No Known Allergies  Histories Past Medical History:  Diagnosis Date   Allergy to alpha-gal    CAD (coronary artery disease)    Cancer (HCC)    basal cell carcinoma, left eyelid, ears   Chronic kidney disease    COPD, mild (HCC)    Enlarged prostate    GERD (gastroesophageal reflux disease)    History of Barrett's esophagus    History of hiatal hernia    History of kidney stones    Hypertension    Mixed hyperlipidemia    Osteoarthritis    PONV (postoperative nausea and vomiting)    Prediabetes    UTI (urinary tract infection)    Past Surgical History:  Procedure Laterality Date   COLONOSCOPY  09/17/2012   Small internal hemorrhoids. Otherwise normal colonoscopy to cecum.    ESOPHAGOGASTRODUODENOSCOPY  09/17/2012   High  esophageal stricture (Barretts stricture) status post esophageal dilatation. Long segment Barretts esophagus. Moderate sized hiatal hernia.    kidney stone surgery     KNEE ARTHROSCOPY     LID LESION EXCISION Left 04/13/2017   Procedure: REMOVAL BIOPSY OF EYE LID LESION;  Surgeon: Karan Osgood, MD;  Location: [facility] OR;  Service: Ophthalmology;  Laterality: Left;   RECONSTRUCTION OF  EYELID Left 04/13/2017   Procedure: TOTAL RECONSTRUCTION OF LOWER EYELID;  Surgeon: Karan Osgood, MD;  Location: [facility] OR;  Service: Ophthalmology;  Laterality: Left;   UPPER GASTROINTESTINAL ENDOSCOPY     Social History   Socioeconomic History   Marital status: Married    Spouse name: Not on file   Number of children: 2   Years of education: Not on file   Highest education level: Not on file  Occupational History   Occupation: Retired Personnel officer  Tobacco Use   Smoking status: Never   Smokeless tobacco: Never  Vaping Use   Vaping status: Never Used  Substance and Sexual Activity   Alcohol use: Yes    Comment: rare   Drug use: No   Sexual activity: Not on file  Other Topics Concern   Not on file  Social History Narrative   Not on file   Social Drivers of Health   Financial Resource Strain: Not on file  Food Insecurity: Low Risk  (04/27/2023)   Received from [facility]   Hunger Vital Sign    Within the past 12 months, you worried that your food would run out before you got money to buy more: Never true    Within the past 12 months, the food you bought just didn't last and you didn't have money to get more. : Never true  Transportation Needs: No Transportation Needs (04/27/2023)   Received from [facility]    In the past 12 months, has lack of reliable transportation kept you from medical appointments, meetings, work or from getting things needed for daily living? : No  Physical Activity: Not on file  Stress: Not on file  Social Connections: Not on file  Intimate Partner Violence: Not on file   Family History  Problem Relation Age of Onset   CVA Mother    Heart attack Father    Colon cancer Neg Hx    Esophageal cancer Neg Hx    Colon polyps Neg Hx    Rectal cancer Neg Hx    Stomach cancer Neg Hx    I have reviewed his medical, social, and family history in detail and updated the electronic medical record as necessary.    PHYSICAL  EXAMINATION  BP 122/60   Pulse (!) 56   Ht 5' 10 (1.778 m)   Wt 187 lb 4 oz (84.9 kg)   BMI 26.87 kg/m  Wt Readings from Last 3 Encounters:  01/26/24 187 lb 4 oz (84.9 kg)  11/24/23 195 lb (88.5 kg)  09/25/23 195 lb (88.5 kg)   GEN: NAD, appears stated age, doesn't appear chronically ill PSYCH: Cooperative, without pressured speech EYE: Conjunctivae pink, sclerae anicteric ENT: MMM CV: Nontachycardic RESP: No audible wheezing GI: NABS, soft, NT/ND, without rebound or guarding, no HSM appreciated GU: DRE shows MSK/EXT: No significant lower extremity edema SKIN: No jaundice, no spider angiomata NEURO:  Alert & Oriented x 3, no focal deficits, no evidence of asterixis   REVIEW OF DATA  I reviewed the following data at the time of this encounter:  GI Procedures  and Studies  ***  Laboratory Studies  ***  Imaging Studies  ***   ASSESSMENT  Mr. Torelli is a 77 y.o. male with a pmh significant for The patient is seen today for evaluation and management of:  No diagnosis found.  ***   PLAN  There are no diagnoses linked to this encounter.   No orders of the defined types were placed in this encounter.   New Prescriptions   No medications on file   Modified Medications   No medications on file    Planned Follow Up No follow-ups on file.   Total Time in Face-to-Face and in Coordination of Care for patient including independent/personal interpretation/review of prior testing, medical history, examination, medication adjustment, communicating results with the patient directly, and documentation within the EHR is ***.   Yong Henle, MD [facility] Office # [phone]

## 2024-01-26 NOTE — Patient Instructions (Signed)
 _______________________________________________________  If your blood pressure at your visit was 140/90 or greater, please contact your primary care physician to follow up on this.  _______________________________________________________  If you are age 77 or older, your body mass index should be between 23-30. Your Body mass index is 26.87 kg/m. If this is out of the aforementioned range listed, please consider follow up with your Primary Care Provider.  If you are age 64 or younger, your body mass index should be between 19-25. Your Body mass index is 26.87 kg/m. If this is out of the aformentioned range listed, please consider follow up with your Primary Care Provider.   ________________________________________________________  The [facility] providers would like to encourage you to use MYCHART to communicate with providers for non-urgent requests or questions.  Due to long hold times on the telephone, sending your provider a message by [facility] may be a faster and more efficient way to get a response.  Please allow 48 business hours for a response.  Please remember that this is for non-urgent requests.  _______________________________________________________  Ryan Zamora have been scheduled for an endoscopy. Please follow written instructions given to you at your visit today.  If you use inhalers (even only as needed), please bring them with you on the day of your procedure.  If you take any of the following medications, they will need to be adjusted prior to your procedure:   DO NOT TAKE 7 DAYS PRIOR TO TEST- Trulicity (dulaglutide) Ozempic, Wegovy (semaglutide) Mounjaro (tirzepatide) Bydureon Bcise (exanatide extended release)  DO NOT TAKE 1 DAY PRIOR TO YOUR TEST Rybelsus (semaglutide) Adlyxin (lixisenatide) Victoza (liraglutide) Byetta (exanatide) ___________________________________________________________________________  Due to recent changes in healthcare laws, you may see the  results of your imaging and laboratory studies on MyChart before your provider has had a chance to review them.  We understand that in some cases there may be results that are confusing or concerning to you. Not all laboratory results come back in the same time frame and the provider may be waiting for multiple results in order to interpret others.  Please give us  48 hours in order for your provider to thoroughly review all the results before contacting the office for clarification of your results.   Thank you for entrusting me with your care and choosing [facility].  Dr Brice Campi

## 2024-01-27 ENCOUNTER — Encounter: Payer: Self-pay | Admitting: Gastroenterology

## 2024-01-27 DIAGNOSIS — K219 Gastro-esophageal reflux disease without esophagitis: Secondary | ICD-10-CM | POA: Insufficient documentation

## 2024-01-27 DIAGNOSIS — R933 Abnormal findings on diagnostic imaging of other parts of digestive tract: Secondary | ICD-10-CM | POA: Insufficient documentation

## 2024-01-27 DIAGNOSIS — K22711 Barrett's esophagus with high grade dysplasia: Secondary | ICD-10-CM | POA: Insufficient documentation

## 2024-01-27 DIAGNOSIS — K22710 Barrett's esophagus with low grade dysplasia: Secondary | ICD-10-CM | POA: Insufficient documentation

## 2024-02-21 ENCOUNTER — Telehealth: Payer: Self-pay | Admitting: Gastroenterology

## 2024-02-21 NOTE — Telephone Encounter (Signed)
 Procedure:Endoscopy Procedure date: 02/29/24 Procedure location: [facility] Arrival Time: 12:15 am Spoke with the patient Y/N:   No, I left a detailed message at [phone] on 02/21/24 @ 2:03 pm for the patient to return call   No, I left a detailed message at [phone] on 02/22/24 @ 11:40 am for the patient to return call. Mychart message sent.   Yes, Patient's wife on 02/22/24 @ 4:08  Any prep concerns? No Has the patient obtained the prep from the pharmacy ? No prep needed Do you have a care partner and transportation:Yes Any additional concerns? She was concerned about the time sent in [place], I told her yes that was my fault and typing error, yes his time to be there is 12:00 pm/12:15 pm.

## 2024-02-22 ENCOUNTER — Encounter (HOSPITAL_COMMUNITY): Payer: Self-pay | Admitting: Gastroenterology

## 2024-02-23 NOTE — Progress Notes (Signed)
 Attempted to obtain medical history for pre op call via telephone, unable to reach at this time. HIPAA compliant voicemail message left requesting return call to pre surgical testing department.

## 2024-02-29 ENCOUNTER — Telehealth: Payer: Self-pay

## 2024-02-29 ENCOUNTER — Other Ambulatory Visit: Payer: Self-pay

## 2024-02-29 ENCOUNTER — Ambulatory Visit (HOSPITAL_COMMUNITY)

## 2024-02-29 ENCOUNTER — Encounter (HOSPITAL_COMMUNITY)
Admission: RE | Disposition: A | Discharge status: Home / Self Care | Payer: Self-pay | Source: Home / Self Care | Attending: Gastroenterology

## 2024-02-29 ENCOUNTER — Ambulatory Visit (HOSPITAL_COMMUNITY)
Admission: RE | Admit: 2024-02-29 | Discharge: 2024-02-29 | Disposition: A | Discharge status: Home / Self Care | Attending: Gastroenterology | Admitting: Gastroenterology

## 2024-02-29 ENCOUNTER — Encounter (HOSPITAL_COMMUNITY): Payer: Self-pay | Admitting: Gastroenterology

## 2024-02-29 DIAGNOSIS — K2289 Other specified disease of esophagus: Secondary | ICD-10-CM

## 2024-02-29 DIAGNOSIS — K2950 Unspecified chronic gastritis without bleeding: Secondary | ICD-10-CM | POA: Diagnosis not present

## 2024-02-29 DIAGNOSIS — I2510 Atherosclerotic heart disease of native coronary artery without angina pectoris: Secondary | ICD-10-CM | POA: Insufficient documentation

## 2024-02-29 DIAGNOSIS — K3189 Other diseases of stomach and duodenum: Secondary | ICD-10-CM

## 2024-02-29 DIAGNOSIS — K449 Diaphragmatic hernia without obstruction or gangrene: Secondary | ICD-10-CM

## 2024-02-29 DIAGNOSIS — K2270 Barrett's esophagus without dysplasia: Secondary | ICD-10-CM | POA: Diagnosis not present

## 2024-02-29 DIAGNOSIS — K22710 Barrett's esophagus with low grade dysplasia: Secondary | ICD-10-CM

## 2024-02-29 DIAGNOSIS — I129 Hypertensive chronic kidney disease with stage 1 through stage 4 chronic kidney disease, or unspecified chronic kidney disease: Secondary | ICD-10-CM | POA: Insufficient documentation

## 2024-02-29 DIAGNOSIS — K22711 Barrett's esophagus with high grade dysplasia: Secondary | ICD-10-CM | POA: Diagnosis present

## 2024-02-29 DIAGNOSIS — J449 Chronic obstructive pulmonary disease, unspecified: Secondary | ICD-10-CM | POA: Diagnosis not present

## 2024-02-29 DIAGNOSIS — M1990 Unspecified osteoarthritis, unspecified site: Secondary | ICD-10-CM | POA: Insufficient documentation

## 2024-02-29 DIAGNOSIS — E782 Mixed hyperlipidemia: Secondary | ICD-10-CM | POA: Diagnosis not present

## 2024-02-29 DIAGNOSIS — K2970 Gastritis, unspecified, without bleeding: Secondary | ICD-10-CM

## 2024-02-29 DIAGNOSIS — N189 Chronic kidney disease, unspecified: Secondary | ICD-10-CM | POA: Diagnosis not present

## 2024-02-29 DIAGNOSIS — K219 Gastro-esophageal reflux disease without esophagitis: Secondary | ICD-10-CM | POA: Insufficient documentation

## 2024-02-29 HISTORY — PX: ESOPHAGOGASTRODUODENOSCOPY: SHX5428

## 2024-02-29 HISTORY — PX: GI RADIOFREQUENCY ABLATION: SHX6807

## 2024-02-29 LAB — GLUCOSE, CAPILLARY: Glucose-Capillary: 121 mg/dL — ABNORMAL HIGH (ref 70–99)

## 2024-02-29 SURGERY — EGD (ESOPHAGOGASTRODUODENOSCOPY)
Anesthesia: General

## 2024-02-29 MED ORDER — PROPOFOL 10 MG/ML IV BOLUS
INTRAVENOUS | Status: DC | PRN
  Administered 2024-02-29: 140 mg via INTRAVENOUS

## 2024-02-29 MED ORDER — ACETAMINOPHEN-CODEINE 120-12 MG/5ML PO SOLN: 10.0000 mL | Freq: Four times a day (QID) | ORAL | 0 refills | Status: DC | PRN

## 2024-02-29 MED ORDER — LIDOCAINE VISCOUS HCL 2 % MT SOLN: 30.0000 mL | Freq: Four times a day (QID) | OROMUCOSAL | 3 refills | Status: DC

## 2024-02-29 MED ORDER — HYOSCYAMINE SULFATE 0.125 MG SL SUBL
0.2500 mg | SUBLINGUAL_TABLET | Freq: Once | SUBLINGUAL | Status: AC
  Administered 2024-02-29: 0.25 mg via SUBLINGUAL
  Filled 2024-02-29: qty 2

## 2024-02-29 MED ORDER — PROPOFOL 1000 MG/100ML IV EMUL
INTRAVENOUS | Status: AC
  Filled 2024-02-29: qty 100

## 2024-02-29 MED ORDER — CIPROFLOXACIN IN D5W 400 MG/200ML IV SOLN
INTRAVENOUS | Status: AC
Start: 2024-02-29 — End: 2024-02-29
  Filled 2024-02-29: qty 200

## 2024-02-29 MED ORDER — FENTANYL CITRATE (PF) 100 MCG/2ML IJ SOLN
INTRAMUSCULAR | Status: DC | PRN
  Administered 2024-02-29: 50 ug via INTRAVENOUS
  Administered 2024-02-29 (×2): 25 ug via INTRAVENOUS
  Administered 2024-02-29: 50 ug via INTRAVENOUS

## 2024-02-29 MED ORDER — FENTANYL CITRATE (PF) 100 MCG/2ML IJ SOLN
INTRAMUSCULAR | Status: AC
  Filled 2024-02-29: qty 2

## 2024-02-29 MED ORDER — SUCRALFATE 1 GM/10ML PO SUSP: 1.0000 g | Freq: Four times a day (QID) | ORAL | 3 refills | Status: DC

## 2024-02-29 MED ORDER — ACETAMINOPHEN 325 MG PO TABS
650.0000 mg | ORAL_TABLET | Freq: Four times a day (QID) | ORAL | Status: DC | PRN
  Administered 2024-02-29: 650 mg via ORAL
  Filled 2024-02-29 (×2): qty 2

## 2024-02-29 MED ORDER — HYDROCODONE-ACETAMINOPHEN 7.5-325 MG/15ML PO SOLN: 15.0000 mL | Freq: Four times a day (QID) | ORAL | 0 refills | Status: DC | PRN

## 2024-02-29 MED ORDER — SODIUM CHLORIDE 0.9 % IV SOLN: INTRAVENOUS | Status: DC

## 2024-02-29 MED ORDER — PROPOFOL 10 MG/ML IV BOLUS
INTRAVENOUS | Status: AC
  Filled 2024-02-29: qty 20

## 2024-02-29 MED ORDER — STERILE WATER FOR INJECTION IJ SOLN
RESPIRATORY_TRACT | Status: DC | PRN
  Administered 2024-02-29: 60 mL via OROMUCOSAL

## 2024-02-29 MED ORDER — PROPOFOL 500 MG/50ML IV EMUL
INTRAVENOUS | Status: DC | PRN
  Administered 2024-02-29: 150 ug/kg/min via INTRAVENOUS
  Administered 2024-02-29: 125 ug/kg/min via INTRAVENOUS

## 2024-02-29 MED ORDER — ONDANSETRON HCL 4 MG/2ML IJ SOLN
INTRAMUSCULAR | Status: DC | PRN
  Administered 2024-02-29: 4 mg via INTRAVENOUS

## 2024-02-29 MED ORDER — STERILE WATER FOR IRRIGATION IR SOLN
Status: DC | PRN
  Administered 2024-02-29 (×2): 60 mL

## 2024-02-29 MED ORDER — ACETYLCYSTEINE 20 % IN SOLN
RESPIRATORY_TRACT | Status: AC
  Filled 2024-02-29: qty 4

## 2024-02-29 MED ORDER — ROCURONIUM BROMIDE 100 MG/10ML IV SOLN
INTRAVENOUS | Status: DC | PRN
  Administered 2024-02-29: 15 mg via INTRAVENOUS
  Administered 2024-02-29: 40 mg via INTRAVENOUS

## 2024-02-29 MED ORDER — DEXAMETHASONE SODIUM PHOSPHATE 10 MG/ML IJ SOLN
INTRAMUSCULAR | Status: DC | PRN
  Administered 2024-02-29: 10 mg via INTRAVENOUS

## 2024-02-29 MED ORDER — PHENYLEPHRINE 80 MCG/ML (10ML) SYRINGE FOR IV PUSH (FOR BLOOD PRESSURE SUPPORT)
PREFILLED_SYRINGE | INTRAVENOUS | Status: DC | PRN
  Administered 2024-02-29 (×2): 160 ug via INTRAVENOUS
  Administered 2024-02-29: 80 ug via INTRAVENOUS

## 2024-02-29 MED ORDER — DICYCLOMINE HCL 10 MG/5ML PO SOLN
10.0000 mg | Freq: Once | ORAL | Status: AC
  Administered 2024-02-29: 10 mg via ORAL
  Filled 2024-02-29: qty 5

## 2024-02-29 MED ORDER — ALUM & MAG HYDROXIDE-SIMETH 200-200-20 MG/5ML PO SUSP
30.0000 mL | Freq: Once | ORAL | Status: AC
  Administered 2024-02-29: 30 mL via ORAL
  Filled 2024-02-29: qty 30

## 2024-02-29 MED ORDER — SUGAMMADEX SODIUM 200 MG/2ML IV SOLN
INTRAVENOUS | Status: DC | PRN
  Administered 2024-02-29: 200 mg via INTRAVENOUS

## 2024-02-29 MED ORDER — LIDOCAINE HCL (CARDIAC) PF 100 MG/5ML IV SOSY
PREFILLED_SYRINGE | INTRAVENOUS | Status: DC | PRN
  Administered 2024-02-29: 60 mg via INTRAVENOUS

## 2024-02-29 MED ORDER — FENTANYL CITRATE (PF) 100 MCG/2ML IJ SOLN
INTRAMUSCULAR | Status: AC
Start: 2024-02-29 — End: 2024-02-29
  Filled 2024-02-29: qty 2

## 2024-02-29 MED ORDER — CIPROFLOXACIN IN D5W 400 MG/200ML IV SOLN
400.0000 mg | Freq: Two times a day (BID) | INTRAVENOUS | Status: DC
  Administered 2024-02-29: 400 mg via INTRAVENOUS

## 2024-02-29 NOTE — H&P (Signed)
 GASTROENTEROLOGY PROCEDURE H&P NOTE   Primary Care Physician: Thurmond Cathlyn LABOR., MD  HPI: Ryan Zamora is a 78 y.o. male who presents for EGD for evaluation of Barrett's esophagus (long-segment) with areas of HGD.  Here for possible RFA vs EMR vs repeat sampling/biopsies.  Past Medical History:  Diagnosis Date   Allergy to alpha-gal    CAD (coronary artery disease)    Cancer (HCC)    basal cell carcinoma, left eyelid, ears   Chronic kidney disease    COPD, mild (HCC)    Enlarged prostate    GERD (gastroesophageal reflux disease)    History of Barrett's esophagus    History of hiatal hernia    History of kidney stones    Hypertension    Mixed hyperlipidemia    Osteoarthritis    PONV (postoperative nausea and vomiting)    Prediabetes    UTI (urinary tract infection)    Past Surgical History:  Procedure Laterality Date   COLONOSCOPY  09/17/2012   Small internal hemorrhoids. Otherwise normal colonoscopy to cecum.    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 Long segment Barretts esophagus. Moderate sized hiatal hernia.    kidney stone surgery     KNEE ARTHROSCOPY     LID LESION EXCISION Left 04/13/2017   Procedure: REMOVAL BIOPSY OF EYE LID LESION;  Surgeon: Laurie Loyd Redhead, MD;  Location: [facility] OR;  Service: Ophthalmology;  Laterality: Left;   RECONSTRUCTION OF EYELID Left 04/13/2017   Procedure: TOTAL RECONSTRUCTION OF LOWER EYELID;  Surgeon: Laurie Loyd Redhead, MD;  Location: [facility] OR;  Service: Ophthalmology;  Laterality: Left;   UPPER GASTROINTESTINAL ENDOSCOPY     Current Facility-Administered Medications  Medication Dose Route Frequency Provider Last Rate Last Admin   0.9 %  sodium chloride  infusion   Intravenous Continuous Mansouraty, Aloha Raddle., MD        Current Facility-Administered Medications:    0.9 %  sodium chloride  infusion, , Intravenous, Continuous, Mansouraty, Aloha Raddle.,  MD No Known Allergies Family History  Problem Relation Age of Onset   CVA Mother    Heart attack Father    Colon cancer Neg Hx    Esophageal cancer Neg Hx    Colon polyps Neg Hx    Rectal cancer Neg Hx    Stomach cancer Neg Hx    Inflammatory bowel disease Neg Hx    Liver disease Neg Hx    Pancreatic cancer Neg Hx    Social History   Socioeconomic History   Marital status: Married    Spouse name: Not on file   Number of children: 2   Years of education: Not on file   Highest education level: Not on file  Occupational History   Occupation: Retired Personnel officer  Tobacco Use   Smoking status: Never   Smokeless tobacco: Never  Vaping Use   Vaping status: Never Used  Substance and Sexual Activity   Alcohol use: Yes    Comment: rare   Drug use: No   Sexual activity: Not on file  Other Topics Concern   Not on file  Social History Narrative   Not on file   Social Drivers of Health   Financial Resource Strain: Not on file  Food Insecurity: Low Risk  (04/27/2023)   Received from [facility]   Hunger Vital Sign    Within the past 12 months, you worried that your food would run out before you got money to buy more:  Never true    Within the past 12 months, the food you bought just didn't last and you didn't have money to get more. : Never true  Transportation Needs: No Transportation Needs (04/27/2023)   Received from [facility]    In the past 12 months, has lack of reliable transportation kept you from medical appointments, meetings, work or from getting things needed for daily living? : No  Physical Activity: Not on file  Stress: Not on file  Social Connections: Not on file  Intimate Partner Violence: Not on file    Physical Exam: There were no vitals filed for this visit. There is no height or weight on file to calculate BMI. GEN: NAD EYE: Sclerae anicteric ENT: MMM CV: Non-tachycardic GI: Soft, NT/ND NEURO:  Alert & Oriented x 3  Lab  Results: No results for input(s): WBC, HGB, HCT, PLT in the last 72 hours. BMET No results for input(s): NA, K, CL, CO2, GLUCOSE, BUN, CREATININE, CALCIUM in the last 72 hours. LFT No results for input(s): PROT, ALBUMIN, AST, ALT, ALKPHOS, BILITOT, BILIDIR, IBILI in the last 72 hours. PT/INR No results for input(s): LABPROT, INR in the last 72 hours.   Impression / Plan: This is a 78 y.o.male who presents for EGD for evaluation of Barrett's esophagus (long-segment) with areas of HGD.  Here for possible RFA vs EMR vs repeat sampling/biopsies.  The risks and benefits of endoscopic evaluation/treatment were discussed with the patient and/or family; these include but are not limited to the risk of perforation, infection, bleeding, missed lesions, lack of diagnosis, severe illness requiring hospitalization, as well as anesthesia and sedation related illnesses.  The patient's history has been reviewed, patient examined, no change in status, and deemed stable for procedure.  The patient and/or family is agreeable to proceed.    Aloha Finner, MD [facility] Office # [phone]

## 2024-02-29 NOTE — Telephone Encounter (Signed)
-----   Message from [facility] sent at 02/29/2024  3:16 PM EDT ----- Regarding: Followup Ryan Zamora, This patient needs EGD repeat to be done in October. This should be one of our first priority patient so she needed put a recall in for September that is fine. EGD RFA. Thanks. GM

## 2024-02-29 NOTE — Telephone Encounter (Signed)
 Pt will be set up as soon as October schedule is open.

## 2024-02-29 NOTE — Op Note (Addendum)
 [facility] Patient Name: Ryan Zamora Procedure Date: 02/29/2024 MRN: [id number] Attending MD: Aloha Finner , MD, [id number] Date of Birth: [birth year] CSN: [id number] Age: 78 Admit Type: Outpatient Procedure:                Upper GI endoscopy Indications:              For endoscopic therapy of Barrett's esophagus with                            low grade dysplasia, For endoscopic therapy of                            Barrett's esophagus with high grade dysplasia Providers:                Aloha Finner, MD, Ozell Pouch, Lorrayne Kitty, Technician Referring MD:             Lynnie Bring, MD Medicines:                General Anesthesia, Cipro  400 mg IV Complications:            No immediate complications. Estimated Blood Loss:     Estimated blood loss was minimal. Procedure:                Pre-Anesthesia Assessment:                           - Prior to the procedure, a History and Physical                            was performed, and patient medications and                            allergies were reviewed. The patient's tolerance of                            previous anesthesia was also reviewed. The risks                            and benefits of the procedure and the sedation                            options and risks were discussed with the patient.                            All questions were answered, and informed consent                            was obtained. Prior Anticoagulants: The patient has                            taken no anticoagulant or antiplatelet agents. ASA  Grade Assessment: III - A patient with severe                            systemic disease. After reviewing the risks and                            benefits, the patient was deemed in satisfactory                            condition to undergo the procedure.                           After obtaining informed consent, the  endoscope was                            passed under direct vision. Throughout the                            procedure, the patient's blood pressure, pulse, and                            oxygen saturations were monitored continuously. The                            [id number]-H190 ([id number]) Olympus endoscope was introduced                            through the mouth, and advanced to the second part                            of duodenum. The upper GI endoscopy was                            accomplished without difficulty. The patient                            tolerated the procedure. Scope In: Scope Out: Findings:      The esophagus and gastroesophageal junction were examined with white       light and narrow band imaging (NBI) from a forward view and retroflexed       position. There were esophageal mucosal changes secondary to established       long-segment Barrett's disease, classified as Barrett's stage C11-M11       per Prague criteria. These changes involved the mucosa extending to an       irregular Z-line (25 cm from the incisors). Circumferential       salmon-colored mucosa was present from 25 to 36 cm. The maximum       longitudinal extent of these esophageal mucosal changes was 11 cm in       length. Circumferential radiofrequency ablation of Barrett's esophagus       was performed using the Barrx 360 Express catheter and balloon-based       endoscopic ablation system. With the endoscope in place, the position       and extent of the Barrett's mucosa and the anatomic landmarks including  proximal and distal extent of Barrett's mucosa and top of gastric folds       were noted. Endoscopic visualization identified an ablation site       including the entire visible Barrett's segment. The Barrett's mucosa was       irrigated with N-acetylcysteine  (Mucomyst ) 1% mixed with water .       Esophageal contents were suctioned. A guidewire was passed down the       biopsy channel of  the endoscope. As the endoscope was withdrawn from the       mouth, the guidewire was left in place. An auto-sizing radiofrequency       ablation balloon catheter was passed transorally over the guidewire into       the esophagus. The endoscope was introduced in a side-by-side manner       with the ablation catheter to confirm appropriate placement. Under       direct endoscopic visualization, the balloon ablation catheter was       positioned so that the proximal edge of the electrode was at 37 cm from       the incisors. The balloon was automatically inflated, and energy was       applied at 10 J/cm2. The balloon electrode was moved 4 cm distally, so       that the proximal edge of the electrode was aligned with the distal edge       of the ablation zone. The process of balloon inflation and ablation was       repeated until the top of the gastric folds was reached. The ablation       catheter and guidewire were removed, and the balloon was cleaned. The       ablation zone was then cleaned of overlying coagulative debris using       irrigation and suction via the endoscope and a cleaning cap. The       guidewire was reinserted, and then the ablation catheter was       reintroduced into the esophagus over the wire. The ablation catheter was       positioned under direct endoscopic visualization so that the proximal       edge of the electrode was at the proximal edge of the ablation zone.       Reinflation and a second round of ablation were performed with the       application of 10 J/cm2 to re-treat the Barrett's epithelium already       treated with the first round of ablation. The ablation catheter and       guidewire were then removed. The areas of the esophagus where Barrett's       mucosa had been ablated were then examined with the endoscope. Areas of       Barrett's esophagus were completely ablated. Whitish changes of ablated       mucosa were present. The total number of energy  applications for all       mucosal sites treated was 8. There was no evidence for perforation. Mild       bleeding was present.      A 4 cm hiatal hernia was present.      Localized moderately erythematous mucosa without bleeding was found on       the posterior wall of the stomach.      No other gross lesions were noted in the entire examined stomach.       Biopsies  were taken with a cold forceps for histology and Helicobacter       pylori testing.      No gross lesions were noted in the duodenal bulb, in the first portion       of the duodenum and in the second portion of the duodenum. Impression:               - Esophageal mucosal changes secondary to                            established long-segment Barrett's disease,                            classified as Barrett's stage C11-M11 per Prague                            criteria. Treated with radiofrequency ablation                            using 360 RFA express. Appropriate mucosal changes                            noted post ablation. Mild bleeding noted that                            stopped on its own. No evidence of perforation.                           - 4 cm hiatal hernia.                           - Erythematous mucosa in the posterior wall of the                            stomach. No other gross lesions in the entire                            stomach. Biopsied.                           - No gross lesions in the duodenal bulb, in the                            first portion of the duodenum and in the second                            portion of the duodenum. Moderate Sedation:      Not Applicable - Patient had care per Anesthesia. Recommendation:           - The patient will be observed post-procedure,                            until all discharge criteria are met.                           -  Discharge patient to home.                           - Patient has a contact number available for                             emergencies. The signs and symptoms of potential                            delayed complications were discussed with the                            patient. Return to normal activities tomorrow.                            Written discharge instructions were provided to the                            patient.                           - Clear liquid diet for 24 hours. Then, full liquid                            diet for 72 hours. Then, soft diet for 1 week                            thereafter. Then advance your diet thereafter to                            regular.                           - Continue your PPI 40 mg twice daily for the next                            3 months.                           - You will be prescribed a lidocaine /antiacid                            mixture that you can take up to 4 times per day as                            needed (prescription has been sent to your                            pharmacy) - take no matter what for at least first                            3-days.                           -  You will be prescribed liquid Tylenol  with                            hydrocodone  that you can use up to 4 times daily                            for the next 72 hours if needed (prescription to be                            sent to your pharmacy) - I recommend using this                            even if you do not have pain for the first 2-3 days.                           - You will be prescribed Sucralfate  Suspension and                            you will take this 4 times daily (make sure you are                            not taking any other medication 1 hour before or 1                            hour after this medication is taken) to allow                            coating and healing of your esophagus - this needs                            to be taken for at least 19-month (you should have                            refills available but call if  you run out).                           - Continue present medications.                           - Repeat upper endoscopy in 3-4 months for                            retreatment.                           - The findings and recommendations were discussed                            with the patient.                           - The findings and recommendations were discussed  with the patient's family. Procedure Code(s):        --- Professional ---                           [phone], Esophagogastroduodenoscopy, flexible,                            transoral; with ablation of tumor(s), polyp(s), or                            other lesion(s) (includes pre- and post-dilation                            and guide wire passage, when performed)                           43239, 59, Esophagogastroduodenoscopy, flexible,                            transoral; with biopsy, single or multiple Diagnosis Code(s):        --- Professional ---                           K44.9, Diaphragmatic hernia without obstruction or                            gangrene                           K31.89, Other diseases of stomach and duodenum                           K22.711, Barrett's esophagus with high grade                            dysplasia CPT copyright 2022 American Medical Association. All rights reserved. The codes documented in this report are preliminary and upon coder review may  be revised to meet current compliance requirements. Aloha Finner, MD 02/29/2024 3:16:51 PM Number of Addenda: 0

## 2024-02-29 NOTE — Anesthesia Preprocedure Evaluation (Addendum)
 Anesthesia Evaluation  Patient identified by MRN, date of birth, ID band Patient awake    Reviewed: Allergy & Precautions, H&P , NPO status , Patient's Chart, lab work & pertinent test results, reviewed documented beta blocker date and time   History of Anesthesia Complications (+) PONV and history of anesthetic complications  Airway Mallampati: II  TM Distance: >3 FB Neck ROM: Full    Dental no notable dental hx. (+) Teeth Intact, Dental Advisory Given   Pulmonary neg pulmonary ROS   Pulmonary exam normal breath sounds clear to auscultation       Cardiovascular hypertension, Pt. on medications and Pt. on home beta blockers  Rhythm:Regular Rate:Normal     Neuro/Psych negative neurological ROS  negative psych ROS   GI/Hepatic Neg liver ROS, hiatal hernia,GERD  Medicated,,  Endo/Other  negative endocrine ROS    Renal/GU negative Renal ROS  negative genitourinary   Musculoskeletal  (+) Arthritis , Osteoarthritis,    Abdominal   Peds  Hematology negative hematology ROS (+)   Anesthesia Other Findings   Reproductive/Obstetrics negative OB ROS                              Anesthesia Physical Anesthesia Plan  ASA: 2  Anesthesia Plan: General   Post-op Pain Management: Minimal or no pain anticipated   Induction: Intravenous  PONV Risk Score and Plan: 2 and Propofol  infusion and Treatment may vary due to age or medical condition  Airway Management Planned: Oral ETT  Additional Equipment:   Intra-op Plan:   Post-operative Plan: Extubation in OR  Informed Consent: I have reviewed the patients History and Physical, chart, labs and discussed the procedure including the risks, benefits and alternatives for the proposed anesthesia with the patient or authorized representative who has indicated his/her understanding and acceptance.     Dental advisory given  Plan Discussed with:  CRNA  Anesthesia Plan Comments:          Anesthesia Quick Evaluation

## 2024-02-29 NOTE — Anesthesia Procedure Notes (Signed)
 Procedure Name: Intubation Date/Time: 02/29/2024 2:00 PM  Performed by: Landy Chip HERO, CRNAPre-anesthesia Checklist: Patient identified, Emergency Drugs available, Suction available and Patient being monitored Patient Re-evaluated:Patient Re-evaluated prior to induction Oxygen Delivery Method: Circle System Utilized Preoxygenation: Pre-oxygenation with 100% oxygen Induction Type: IV induction Ventilation: Mask ventilation without difficulty Laryngoscope Size: Mac and 4 Grade View: Grade II Tube type: Oral Tube size: 7.5 mm Number of attempts: 1 Airway Equipment and Method: Stylet Placement Confirmation: ETT inserted through vocal cords under direct vision, positive ETCO2 and breath sounds checked- equal and bilateral Secured at: 21 cm Tube secured with: Tape Dental Injury: Teeth and Oropharynx as per pre-operative assessment

## 2024-02-29 NOTE — Anesthesia Postprocedure Evaluation (Signed)
 Anesthesia Post Note  Patient: Christos Mixson  Procedure(s) Performed: EGD (ESOPHAGOGASTRODUODENOSCOPY) RADIOFREQUENCY ABLATION, UPPER GI TRACT, ENDOSCOPIC     Patient location during evaluation: Endoscopy Anesthesia Type: General Level of consciousness: awake and alert Pain management: pain level controlled Vital Signs Assessment: post-procedure vital signs reviewed and stable Respiratory status: spontaneous breathing, nonlabored ventilation and respiratory function stable Cardiovascular status: blood pressure returned to baseline and stable Postop Assessment: no apparent nausea or vomiting Anesthetic complications: no   No notable events documented.  Last Vitals:  Vitals:   02/29/24 1530 02/29/24 1600  BP: (!) 147/81   Pulse: 63 63  Resp: 10 11  Temp:    SpO2: 96% 98%    Last Pain:  Vitals:   02/29/24 1606  TempSrc:   PainSc: 4                  Ram Haugan,W. EDMOND

## 2024-02-29 NOTE — Transfer of Care (Signed)
 Immediate Anesthesia Transfer of Care Note  Patient: Ryan Zamora  Procedure(s) Performed: EGD (ESOPHAGOGASTRODUODENOSCOPY) RADIOFREQUENCY ABLATION, UPPER GI TRACT, ENDOSCOPIC  Patient Location: PACU  Anesthesia Type:General  Level of Consciousness: drowsy and patient cooperative  Airway & Oxygen Therapy: Patient Spontanous Breathing  Post-op Assessment: Report given to RN and Post -op Vital signs reviewed and stable  Post vital signs: Reviewed and stable  Last Vitals:  Vitals Value Taken Time  BP 147/62 02/29/24 15:03  Temp    Pulse 72 02/29/24 15:06  Resp 16 02/29/24 15:06  SpO2 95 % on RA 02/29/24 15:06  Vitals shown include unfiled device data.  Last Pain:  Vitals:   02/29/24 1314  TempSrc: Temporal  PainSc: 0-No pain      Patients Stated Pain Goal: 0 (02/29/24 1314)  Complications: No notable events documented.

## 2024-02-29 NOTE — Discharge Instructions (Addendum)
 YOU HAD AN ENDOSCOPIC PROCEDURE TODAY: Refer to the procedure report and other information in the discharge instructions given to you for any specific questions about what was found during the examination. If this information does not answer your questions, please call [place] office at [phone] to clarify.   YOU SHOULD EXPECT: Some feelings of bloating in the abdomen. Passage of more gas than usual. Walking can help get rid of the air that was put into your GI tract during the procedure and reduce the bloating. If you had a lower endoscopy (such as a colonoscopy or flexible sigmoidoscopy) you may notice spotting of blood in your stool or on the toilet paper. Some abdominal soreness may be present for a day or two, also.  DIET: Clear liquids for 24 hours. Full liquids for 72 hours. Soft diet for 1 week. Then resume to regular diet.  ACTIVITY: Your care partner should take you home directly after the procedure. You should plan to take it easy, moving slowly for the rest of the day. You can resume normal activity the day after the procedure however YOU SHOULD NOT DRIVE, use power tools, machinery or perform tasks that involve climbing or major physical exertion for 24 hours (because of the sedation medicines used during the test).   SYMPTOMS TO REPORT IMMEDIATELY: A gastroenterologist can be reached at any hour. Please call [phone]  for any of the following symptoms:   Following upper endoscopy (EGD, EUS, ERCP, esophageal dilation) Vomiting of blood or coffee ground material  New, significant abdominal pain  New, significant chest pain or pain under the shoulder blades  Painful or persistently difficult swallowing  New shortness of breath  Black, tarry-looking or red, bloody stools  FOLLOW UP:  If any biopsies were taken you will be contacted by phone or by letter within the next 1-3 weeks. Call [phone]  if you have not heard about the biopsies in 3 weeks.  Please also call with any  specific questions about appointments or follow up tests.

## 2024-03-01 ENCOUNTER — Encounter (HOSPITAL_COMMUNITY): Payer: Self-pay | Admitting: Gastroenterology

## 2024-03-04 LAB — SURGICAL PATHOLOGY

## 2024-03-06 ENCOUNTER — Other Ambulatory Visit: Payer: Self-pay

## 2024-03-06 DIAGNOSIS — K22711 Barrett's esophagus with high grade dysplasia: Secondary | ICD-10-CM

## 2024-03-06 NOTE — Telephone Encounter (Signed)
 EGD RFA has been set up for 03/21/24 at 1145 am with GM at [facility]

## 2024-03-06 NOTE — Telephone Encounter (Signed)
 Left message on machine to call back

## 2024-03-07 ENCOUNTER — Ambulatory Visit: Payer: Self-pay | Admitting: Gastroenterology

## 2024-03-07 ENCOUNTER — Other Ambulatory Visit: Payer: Self-pay

## 2024-03-07 MED ORDER — LIDOCAINE VISCOUS HCL 2 % MT SOLN: 30.0000 mL | Freq: Four times a day (QID) | OROMUCOSAL | 3 refills | Status: DC

## 2024-03-07 NOTE — Telephone Encounter (Signed)
 Left message on machine to call back

## 2024-03-07 NOTE — Telephone Encounter (Signed)
Patient's wife returned call.  

## 2024-03-07 NOTE — Telephone Encounter (Signed)
EGD RFA scheduled, pt instructed and medications reviewed.  Patient instructions mailed to home.  Patient to call with any questions or concerns.

## 2024-03-13 ENCOUNTER — Telehealth: Payer: Self-pay | Admitting: Gastroenterology

## 2024-03-13 NOTE — Telephone Encounter (Signed)
 Procedure:Endoscopy Procedure date: 03/21/24 Procedure location: [facility] Arrival Time: 10:15 am Spoke with the patient Y/N:   No, I left a detailed message on [phone] on 03/13/24 @ 3:06 pm for the patient to return call Procedure has been changed to 05/20/24    Any prep concerns? ___  Has the patient obtained the prep from the pharmacy ? ___ Do you have a care partner and transportation: ___ Any additional concerns? ___

## 2024-03-14 ENCOUNTER — Encounter (HOSPITAL_COMMUNITY): Payer: Self-pay | Admitting: Gastroenterology

## 2024-03-14 ENCOUNTER — Telehealth: Payer: Self-pay | Admitting: Gastroenterology

## 2024-03-14 NOTE — Telephone Encounter (Signed)
 Patient wife called and stated that she was told that her husband needed to follow up with Dr.Gupta for his procedure he had on the 17 th of July. And he would be scheduled for another EGD in 3 months. Patient wife is confused on why her husband is schedule for an EGD on August the 7 th. Patient wife says that he has still not recovered completely from the one her recently had. Patient wife would like the nurse to return her call. Please advise.

## 2024-03-14 NOTE — Telephone Encounter (Signed)
 The appt has been moved to 10/6 at [facility] with GM  New instructions have been sent to My Chart  All information has been reviewed and all questions answered to the best of my ability

## 2024-04-22 ENCOUNTER — Ambulatory Visit: Admitting: Gastroenterology

## 2024-04-22 ENCOUNTER — Encounter: Payer: Self-pay | Admitting: Gastroenterology

## 2024-04-22 ENCOUNTER — Other Ambulatory Visit (INDEPENDENT_AMBULATORY_CARE_PROVIDER_SITE_OTHER)

## 2024-04-22 VITALS — BP 130/60 | HR 72 | Ht 70.0 in | Wt 189.2 lb

## 2024-04-22 DIAGNOSIS — K5909 Other constipation: Secondary | ICD-10-CM | POA: Diagnosis not present

## 2024-04-22 DIAGNOSIS — K2270 Barrett's esophagus without dysplasia: Secondary | ICD-10-CM

## 2024-04-22 LAB — CBC WITH DIFFERENTIAL/PLATELET
Basophils Absolute: 0.1 K/uL (ref 0.0–0.1)
Basophils Relative: 0.7 % (ref 0.0–3.0)
Eosinophils Absolute: 0.3 K/uL (ref 0.0–0.7)
Eosinophils Relative: 3.4 % (ref 0.0–5.0)
HCT: 43.5 % (ref 39.0–52.0)
Hemoglobin: 14.6 g/dL (ref 13.0–17.0)
Lymphocytes Relative: 32.5 % (ref 12.0–46.0)
Lymphs Abs: 2.5 K/uL (ref 0.7–4.0)
MCHC: 33.5 g/dL (ref 30.0–36.0)
MCV: 92.7 fl (ref 78.0–100.0)
Monocytes Absolute: 0.5 K/uL (ref 0.1–1.0)
Monocytes Relative: 6.3 % (ref 3.0–12.0)
Neutro Abs: 4.3 K/uL (ref 1.4–7.7)
Neutrophils Relative %: 57.1 % (ref 43.0–77.0)
Platelets: 160 K/uL (ref 150.0–400.0)
RBC: 4.69 Mil/uL (ref 4.22–5.81)
RDW: 14.3 % (ref 11.5–15.5)
WBC: 7.5 K/uL (ref 4.0–10.5)

## 2024-04-22 LAB — COMPREHENSIVE METABOLIC PANEL WITH GFR
ALT: 31 U/L (ref 0–53)
AST: 18 U/L (ref 0–37)
Albumin: 4.4 g/dL (ref 3.5–5.2)
Alkaline Phosphatase: 64 U/L (ref 39–117)
BUN: 21 mg/dL (ref 6–23)
CO2: 22 meq/L (ref 19–32)
Calcium: 10.5 mg/dL (ref 8.4–10.5)
Chloride: 107 meq/L (ref 96–112)
Creatinine, Ser: 1.04 mg/dL (ref 0.40–1.50)
GFR: 69.26 mL/min (ref >60.00)
Glucose, Bld: 149 mg/dL — ABNORMAL HIGH (ref 70–99)
Potassium: 3.8 meq/L (ref 3.5–5.1)
Sodium: 138 meq/L (ref 135–145)
Total Bilirubin: 0.4 mg/dL (ref 0.2–1.2)
Total Protein: 7.1 g/dL (ref 6.0–8.3)

## 2024-04-22 NOTE — Progress Notes (Signed)
 Chief Complaint: FU  Referring Provider:  Thurmond Cathlyn LABOR., MD      ASSESSMENT AND PLAN;   #1. 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 Wilhelmenia  #2. Chronic constipation- better with mitralax 17g po QD  Plan: -CBC, CMP -EGD with RFA with Dr Wilhelmenia (Oct 6) -Continue protonix  40 BID   HPI:    Ryan Zamora is a 78 y.o. male  Discussed the use of AI scribe software for clinical note transcription with the patient, who gave verbal consent to proceed.  History of Present Illness  With history of CAD, hypertension, hyperlipidemia, CRI, COPD, nephrolithiasis, alpha gal, Barrett's esophagus (long segment with LGD/HGD)   FU after EGD with Barretts ablation 02/29/2024.  He has done very well thereafter.  Denies having any significant heartburn, odynophagia or dysphagia.  Per patient's wife, he actually feels much better after previous EGD.   Previous GI workup: EGD 02/29/2024 (Dr CHRISTELLA) - Esophageal mucosal changes secondary to established long- segment Barrett' s disease, classified as Barrett' s stage C11- M11 per Prague criteria. Treated with radiofrequency ablation using 360 RFA express. Appropriate mucosal changes noted post ablation. Mild bleeding noted that stopped on its own. No evidence of perforation.  No definite masses.- 4 cm hiatal hernia. - Erythematous mucosa in the posterior wall of the stomach. No other gross lesions in the entire stomach. Biopsied. - No gross lesions in the duodenal bulb, in the first portion of the duodenum and in the second portion of the duodenum.  EGD 11/24/2023 - Esophageal mucosal changes consistent with long- segment Barrett' s esophagus. Biopsied. - 5 cm hiatal hernia. - Gastritis. Biopsied. - Normal examined duodenum. Biopsied. -Bx: Barrett's esophagus with predominantly low-grade dysplasia and occasional foci of high-grade dysplasia.  No malignancy.  Negative small bowel biopsies  for celiac.  Negative gastric biopsies for HP.  Colonoscopy 11/24/2023 - Two 4 to 6 mm polyps in the mid descending colon and in the distal descending colon, removed with a cold snare. Resected and retrieved. - Mild sigmoid diverticulosis. - Non- bleeding internal hemorrhoids. - The examined portion of the ileum was normal. - The examination was otherwise normal on direct and retroflexion views. -Bx- TA/hyperplastic. No need to rpt.    Past Medical History:  Diagnosis Date   Allergy to alpha-gal    CAD (coronary artery disease)    Cancer (HCC)    basal cell carcinoma, left eyelid, ears   Chronic kidney disease    COPD, mild (HCC)    Enlarged prostate    GERD (gastroesophageal reflux disease)    History of Barrett's esophagus    History of hiatal hernia    History of kidney stones    Hypertension    Mixed hyperlipidemia    Osteoarthritis    PONV (postoperative nausea and vomiting)    Prediabetes    UTI (urinary tract infection)     Past Surgical History:  Procedure Laterality Date   COLONOSCOPY  09/17/2012   Small internal hemorrhoids. Otherwise normal colonoscopy to cecum.    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 Long segment Barretts esophagus. Moderate sized hiatal hernia.    ESOPHAGOGASTRODUODENOSCOPY N/A 02/29/2024   Procedure: EGD (ESOPHAGOGASTRODUODENOSCOPY);  Surgeon: Wilhelmenia Aloha Raddle., MD;  Location: [facility];  Service: Gastroenterology;  Laterality: N/A;  FNA   GI RADIOFREQUENCY ABLATION  02/29/2024   Procedure: RADIOFREQUENCY ABLATION, UPPER GI TRACT, ENDOSCOPIC;  Surgeon: Wilhelmenia Aloha  Mickey., MD;  Location: [facility];  Service: Gastroenterology;;   kidney stone surgery     KNEE ARTHROSCOPY     LID LESION EXCISION Left 04/13/2017   Procedure: REMOVAL BIOPSY OF EYE LID LESION;  Surgeon: Laurie Loyd Redhead, MD;  Location: [facility];  Service: Ophthalmology;  Laterality:  Left;   RECONSTRUCTION OF EYELID Left 04/13/2017   Procedure: TOTAL RECONSTRUCTION OF LOWER EYELID;  Surgeon: Laurie Loyd Redhead, MD;  Location: [facility];  Service: Ophthalmology;  Laterality: Left;   UPPER GASTROINTESTINAL ENDOSCOPY      Family History  Problem Relation Age of Onset   CVA Mother    Heart attack Father    Colon cancer Neg Hx    Esophageal cancer Neg Hx    Colon polyps Neg Hx    Rectal cancer Neg Hx    Stomach cancer Neg Hx    Inflammatory bowel disease Neg Hx    Liver disease Neg Hx    Pancreatic cancer Neg Hx     Social History   Tobacco Use   Smoking status: Never   Smokeless tobacco: Never  Vaping Use   Vaping status: Never Used  Substance Use Topics   Alcohol use: Yes    Comment: rare   Drug use: No    Current Outpatient Medications  Medication Sig Dispense Refill   alfuzosin (UROXATRAL) 10 MG 24 hr tablet Take 10 mg by mouth daily.     allopurinol (ZYLOPRIM) 300 MG tablet Take 300 mg by mouth every evening.     cyanocobalamin (VITAMIN B12) 1000 MCG tablet Take 1 tablet by mouth daily.     diclofenac (VOLTAREN) 75 MG EC tablet Take 75 mg by mouth 2 (two) times daily.     dicyclomine  (BENTYL ) 10 MG/5ML solution      doxycycline (VIBRA-TABS) 100 MG tablet Take 100 mg by mouth 2 (two) times daily.     finasteride (PROSCAR) 5 MG tablet Take 5 mg by mouth daily.     GI Cocktail (alum & mag hydroxide, lidocaine , dicyclomine ) oral mixture Take 30 mLs by mouth in the morning, at noon, in the evening, and at bedtime. 90 ml lidocaine  90 ml 10 mg/5 ml dicyclomine  270 ml maalox Swish and swallow 30 ml four times daily 550 mL 3   HYDROcodone -acetaminophen  (HYCET) 7.5-325 mg/15 ml solution Take 15 mLs by mouth 4 (four) times daily as needed for moderate pain (pain score 4-6). 120 mL 0   lisinopril (PRINIVIL,ZESTRIL) 10 MG tablet Take 10 mg by mouth daily.     metoprolol succinate (TOPROL-XL) 25 MG 24 hr tablet Take 25 mg by mouth 2 (two) times daily.     pantoprazole   (PROTONIX ) 40 MG tablet Take 1 tablet (40 mg total) by mouth 2 (two) times daily. 60 tablet 11   simvastatin (ZOCOR) 20 MG tablet Take 20 mg by mouth every evening.     sucralfate  (CARAFATE ) 1 GM/10ML suspension Take 10 mLs (1 g total) by mouth 4 (four) times daily. 420 mL 3   No current facility-administered medications for this visit.    No Known Allergies  Review of Systems:  Constitutional: Denies fever, chills, diaphoresis, appetite change and fatigue.  HEENT: Denies photophobia, eye pain, redness, hearing loss, ear pain, congestion, sore throat, rhinorrhea, sneezing, mouth sores, neck pain, neck stiffness and tinnitus.   Respiratory: Denies SOB, DOE, cough, chest tightness,  and wheezing.   Cardiovascular: Denies chest pain, palpitations and leg swelling.  Genitourinary: Denies dysuria, urgency, frequency, hematuria, flank  pain and difficulty urinating.  Musculoskeletal: Denies myalgias, back pain, joint swelling, arthralgias and gait problem.  Skin: No rash.  Neurological: Denies dizziness, seizures, syncope, weakness, light-headedness, numbness and headaches.  Hematological: Denies adenopathy. Easy bruising, personal or family bleeding history  Psychiatric/Behavioral: No anxiety or depression     Physical Exam:    BP 130/60   Pulse 72   Ht 5' 10 (1.778 m)   Wt 189 lb 4 oz (85.8 kg)   BMI 27.15 kg/m  Wt Readings from Last 3 Encounters:  04/22/24 189 lb 4 oz (85.8 kg)  02/29/24 192 lb (87.1 kg)  01/26/24 187 lb 4 oz (84.9 kg)   Constitutional:  Well-developed, in no acute distress. Psychiatric: Normal mood and affect. Behavior is normal. HEENT: Pupils normal.  Conjunctivae are normal. No scleral icterus. Neck supple.  Cardiovascular: Normal rate, regular rhythm. No edema Pulmonary/chest: Effort normal and breath sounds normal. No wheezing, rales or rhonchi. Abdominal: Soft, nondistended. Nontender. Bowel sounds active throughout. There are no masses palpable. No  hepatomegaly. Rectal: Deferred Neurological: Alert and oriented to person place and time. Skin: Skin is warm and dry. No rashes noted.  Data Reviewed: I have personally reviewed following labs and imaging studies  CBC:    Latest Ref Rng & Units 04/13/2017    6:47 AM  CBC  WBC 4.0 - 10.5 K/uL 7.6   Hemoglobin 13.0 - 17.0 g/dL 84.0   Hematocrit 60.9 - 52.0 % 46.1   Platelets 150 - 400 K/uL 159     CMP:    Latest Ref Rng & Units 04/13/2017    6:47 AM  CMP  Glucose 65 - 99 mg/dL 874   BUN 6 - 20 mg/dL 14   Creatinine 9.38 - 1.24 mg/dL 9.06   Sodium 864 - 854 mmol/L 139   Potassium 3.5 - 5.1 mmol/L 3.9   Chloride 101 - 111 mmol/L 111   CO2 22 - 32 mmol/L 21   Calcium 8.9 - 10.3 mg/dL 9.8         Anselm Bring, MD 04/22/2024, 8:59 AM  Cc: Thurmond Cathlyn LABOR., MD

## 2024-04-22 NOTE — Patient Instructions (Addendum)
 _______________________________________________________  If your blood pressure at your visit was 140/90 or greater, please contact your primary care physician to follow up on this.  _______________________________________________________  If you are age 77 or older, your body mass index should be between 23-30. Your Body mass index is 27.15 kg/m. If this is out of the aforementioned range listed, please consider follow up with your Primary Care Provider.  If you are age 43 or younger, your body mass index should be between 19-25. Your Body mass index is 27.15 kg/m. If this is out of the aformentioned range listed, please consider follow up with your Primary Care Provider.   ________________________________________________________  The [facility] providers would like to encourage you to use MYCHART to communicate with providers for non-urgent requests or questions.  Due to long hold times on the telephone, sending your provider a message by [facility] may be a faster and more efficient way to get a response.  Please allow 48 business hours for a response.  Please remember that this is for non-urgent requests.  _______________________________________________________  [facility] is using a team-based approach to care.  Your team is made up of your doctor and two to three APPS. Our APPS (Nurse Practitioners and Physician Assistants) work with your physician to ensure care continuity for you. They are fully qualified to address your health concerns and develop a treatment plan. They communicate directly with your gastroenterologist to care for you. Seeing the Advanced Practice Practitioners on your physician's team can help you by facilitating care more promptly, often allowing for earlier appointments, access to diagnostic testing, procedures, and other specialty referrals.   Your provider has requested that you go to the basement level for lab work before leaving today. Press B on the  elevator. The lab is located at the first door on the left as you exit the elevator.  Continue Protonix   You have been scheduled for an endoscopy. Please follow written instructions given to you at your visit today.  If you use inhalers (even only as needed), please bring them with you on the day of your procedure.  If you take any of the following medications, they will need to be adjusted prior to your procedure:   DO NOT TAKE 7 DAYS PRIOR TO TEST- Trulicity (dulaglutide) Ozempic, Wegovy (semaglutide) Mounjaro (tirzepatide) Bydureon Bcise (exanatide extended release)  DO NOT TAKE 1 DAY PRIOR TO YOUR TEST Rybelsus (semaglutide) Adlyxin (lixisenatide) Victoza (liraglutide) Byetta (exanatide) ___________________________________________________________________________  Thank you,  Dr. Lynnie Bring

## 2024-04-25 ENCOUNTER — Ambulatory Visit: Payer: Self-pay | Admitting: Gastroenterology

## 2024-05-13 ENCOUNTER — Telehealth: Payer: Self-pay | Admitting: Gastroenterology

## 2024-05-13 NOTE — Telephone Encounter (Signed)
 Error

## 2024-05-14 ENCOUNTER — Encounter (HOSPITAL_COMMUNITY): Payer: Self-pay | Admitting: Gastroenterology

## 2024-05-14 NOTE — Progress Notes (Signed)
 Attempted to obtain medical history for pre op call via telephone, unable to reach at this time. HIPAA compliant voicemail message left requesting return call to pre surgical testing department.

## 2024-05-16 ENCOUNTER — Telehealth: Payer: Self-pay

## 2024-05-16 NOTE — Telephone Encounter (Signed)
 Procedure:EGD Procedure date: 05/20/24 Procedure location: [facility] Arrival Time: 2:48 Spoke with the patient Y/N: N Any prep concerns? N  Has the patient obtained the prep from the pharmacy ? N Do you have a care partner and transportation: N Any additional concerns? N   I called patient on 3 different occasions. I left a detailed message and the office number.

## 2024-05-17 NOTE — Telephone Encounter (Signed)
 No answer. Did not leave another voicemail.

## 2024-05-17 NOTE — Telephone Encounter (Signed)
 Thank you for this update.  Pod C nurses, Can you please try to reach out to the patient and follow-up with him for the procedure next week? Thanks. GM

## 2024-05-20 ENCOUNTER — Other Ambulatory Visit: Payer: Self-pay

## 2024-05-20 ENCOUNTER — Ambulatory Visit (HOSPITAL_COMMUNITY): Admitting: Anesthesiology

## 2024-05-20 ENCOUNTER — Encounter (HOSPITAL_COMMUNITY): Payer: Self-pay | Admitting: Gastroenterology

## 2024-05-20 ENCOUNTER — Encounter (HOSPITAL_COMMUNITY)
Admission: RE | Disposition: A | Discharge status: Home / Self Care | Payer: Self-pay | Source: Home / Self Care | Attending: Gastroenterology

## 2024-05-20 ENCOUNTER — Ambulatory Visit (HOSPITAL_COMMUNITY)
Admission: RE | Admit: 2024-05-20 | Discharge: 2024-05-20 | Disposition: A | Discharge status: Home / Self Care | Attending: Gastroenterology | Admitting: Gastroenterology

## 2024-05-20 DIAGNOSIS — K22711 Barrett's esophagus with high grade dysplasia: Secondary | ICD-10-CM

## 2024-05-20 DIAGNOSIS — R7303 Prediabetes: Secondary | ICD-10-CM | POA: Diagnosis not present

## 2024-05-20 DIAGNOSIS — K449 Diaphragmatic hernia without obstruction or gangrene: Secondary | ICD-10-CM | POA: Diagnosis not present

## 2024-05-20 DIAGNOSIS — I10 Essential (primary) hypertension: Secondary | ICD-10-CM

## 2024-05-20 DIAGNOSIS — K219 Gastro-esophageal reflux disease without esophagitis: Secondary | ICD-10-CM | POA: Insufficient documentation

## 2024-05-20 DIAGNOSIS — K2270 Barrett's esophagus without dysplasia: Secondary | ICD-10-CM | POA: Diagnosis present

## 2024-05-20 DIAGNOSIS — J449 Chronic obstructive pulmonary disease, unspecified: Secondary | ICD-10-CM

## 2024-05-20 DIAGNOSIS — I129 Hypertensive chronic kidney disease with stage 1 through stage 4 chronic kidney disease, or unspecified chronic kidney disease: Secondary | ICD-10-CM | POA: Insufficient documentation

## 2024-05-20 DIAGNOSIS — M1990 Unspecified osteoarthritis, unspecified site: Secondary | ICD-10-CM | POA: Diagnosis not present

## 2024-05-20 DIAGNOSIS — N189 Chronic kidney disease, unspecified: Secondary | ICD-10-CM | POA: Diagnosis not present

## 2024-05-20 DIAGNOSIS — I2510 Atherosclerotic heart disease of native coronary artery without angina pectoris: Secondary | ICD-10-CM

## 2024-05-20 HISTORY — PX: ESOPHAGOGASTRODUODENOSCOPY: SHX5428

## 2024-05-20 SURGERY — EGD (ESOPHAGOGASTRODUODENOSCOPY)
Anesthesia: Monitor Anesthesia Care

## 2024-05-20 MED ORDER — SUCRALFATE 1 GM/10ML PO SUSP: 1.0000 g | Freq: Four times a day (QID) | ORAL | 4 refills | Status: DC

## 2024-05-20 MED ORDER — PANTOPRAZOLE SODIUM 40 MG PO TBEC
40.0000 mg | DELAYED_RELEASE_TABLET | Freq: Two times a day (BID) | ORAL | 11 refills | Status: AC
Start: 2024-05-20 — End: ?

## 2024-05-20 MED ORDER — PROPOFOL 10 MG/ML IV BOLUS
INTRAVENOUS | Status: DC | PRN
  Administered 2024-05-20: 30 mg via INTRAVENOUS
  Administered 2024-05-20 (×3): 40 mg via INTRAVENOUS

## 2024-05-20 MED ORDER — FENTANYL CITRATE (PF) 100 MCG/2ML IJ SOLN
INTRAMUSCULAR | Status: DC | PRN
  Administered 2024-05-20 (×2): 25 ug via INTRAVENOUS

## 2024-05-20 MED ORDER — LIDOCAINE 2% (20 MG/ML) 5 ML SYRINGE
INTRAMUSCULAR | Status: DC | PRN
  Administered 2024-05-20: 80 mg via INTRAVENOUS

## 2024-05-20 MED ORDER — SODIUM CHLORIDE 0.9 % IV SOLN: INTRAVENOUS | Status: DC | PRN

## 2024-05-20 MED ORDER — ACETYLCYSTEINE 20 % IN SOLN
RESPIRATORY_TRACT | Status: AC
Start: 2024-05-20 — End: 2024-05-20
  Filled 2024-05-20: qty 4

## 2024-05-20 MED ORDER — FENTANYL CITRATE (PF) 100 MCG/2ML IJ SOLN
INTRAMUSCULAR | Status: AC
  Filled 2024-05-20: qty 2

## 2024-05-20 MED ORDER — PROPOFOL 500 MG/50ML IV EMUL
INTRAVENOUS | Status: DC | PRN
  Administered 2024-05-20: 125 ug/kg/min via INTRAVENOUS

## 2024-05-20 MED ORDER — ONDANSETRON HCL 4 MG/2ML IJ SOLN
INTRAMUSCULAR | Status: DC | PRN
  Administered 2024-05-20: 4 mg via INTRAVENOUS

## 2024-05-20 MED ORDER — SODIUM CHLORIDE 0.9 % IV SOLN: INTRAVENOUS | Status: DC

## 2024-05-20 MED ORDER — LIDOCAINE VISCOUS HCL 2 % MT SOLN: 30.0000 mL | Freq: Four times a day (QID) | OROMUCOSAL | 0 refills | Status: DC

## 2024-05-20 MED ORDER — LIDOCAINE VISCOUS HCL 2 % MT SOLN: 30.0000 mL | Freq: Four times a day (QID) | OROMUCOSAL | 3 refills | Status: DC

## 2024-05-20 MED ORDER — HYDROCODONE-ACETAMINOPHEN 7.5-325 MG/15ML PO SOLN: 15.0000 mL | Freq: Four times a day (QID) | ORAL | 0 refills | Status: DC | PRN

## 2024-05-20 MED ORDER — LIDOCAINE VISCOUS HCL 2 % MT SOLN: 30.0000 mL | Freq: Four times a day (QID) | OROMUCOSAL | 0 refills | Status: AC

## 2024-05-20 MED ORDER — ACETYLCYSTEINE 20 % IN SOLN
RESPIRATORY_TRACT | Status: DC | PRN
  Administered 2024-05-20: 60 mL via ORAL

## 2024-05-20 NOTE — Discharge Instructions (Addendum)
 YOU HAD AN ENDOSCOPIC PROCEDURE TODAY: Refer to the procedure report and other information in the discharge instructions given to you for any specific questions about what was found during the examination. If this information does not answer your questions, please call [place] office at [phone] to clarify.   YOU SHOULD EXPECT: Some feelings of bloating in the abdomen. Passage of more gas than usual. Walking can help get rid of the air that was put into your GI tract during the procedure and reduce the bloating. If you had a lower endoscopy (such as a colonoscopy or flexible sigmoidoscopy) you may notice spotting of blood in your stool or on the toilet paper. Some abdominal soreness may be present for a day or two, also.  DIET: Clear liquids for the rest of today.  Tomorrow you can start full liquids and continue into the next day.  If you are tolerating full liquids by the 4th day, you can advance to a soft diet.  Drink plenty of fluids but you should avoid alcoholic beverages for 24 hours. If you had a esophageal dilation, please see attached instructions for diet.    ACTIVITY: Your care partner should take you home directly after the procedure. You should plan to take it easy, moving slowly for the rest of the day. You can resume normal activity the day after the procedure however YOU SHOULD NOT DRIVE, use power tools, machinery or perform tasks that involve climbing or major physical exertion for 24 hours (because of the sedation medicines used during the test).   SYMPTOMS TO REPORT IMMEDIATELY: A gastroenterologist can be reached at any hour. Please call [phone]  for any of the following symptoms:  Following lower endoscopy (colonoscopy, flexible sigmoidoscopy) Excessive amounts of blood in the stool  Significant tenderness, worsening of abdominal pains  Swelling of the abdomen that is new, acute  Fever of 100 or higher  Following upper endoscopy (EGD, EUS, ERCP, esophageal  dilation) Vomiting of blood or coffee ground material  New, significant abdominal pain  New, significant chest pain or pain under the shoulder blades  Painful or persistently difficult swallowing  New shortness of breath  Black, tarry-looking or red, bloody stools  FOLLOW UP:  If any biopsies were taken you will be contacted by phone or by letter within the next 1-3 weeks. Call [phone]  if you have not heard about the biopsies in 3 weeks.  Please also call with any specific questions about appointments or follow up tests.

## 2024-05-20 NOTE — Anesthesia Preprocedure Evaluation (Addendum)
 Anesthesia Evaluation  Patient identified by MRN, date of birth, ID band Patient awake    Reviewed: Allergy & Precautions, NPO status , Patient's Chart, lab work & pertinent test results  History of Anesthesia Complications (+) PONV and history of anesthetic complications  Airway Mallampati: II  TM Distance: >3 FB Neck ROM: Full    Dental  (+) Missing,    Pulmonary COPD   Pulmonary exam normal        Cardiovascular hypertension, Pt. on medications + CAD  Normal cardiovascular exam     Neuro/Psych negative neurological ROS  negative psych ROS   GI/Hepatic Neg liver ROS, hiatal hernia,GERD  Medicated,,  Endo/Other   Pre-DM   Renal/GU CRFRenal disease     Musculoskeletal  (+) Arthritis , Osteoarthritis,    Abdominal   Peds  Hematology negative hematology ROS (+)   Anesthesia Other Findings   Reproductive/Obstetrics                              Anesthesia Physical Anesthesia Plan  ASA: 2  Anesthesia Plan: MAC   Post-op Pain Management: Minimal or no pain anticipated   Induction:   PONV Risk Score and Plan: 2 and Propofol  infusion and Treatment may vary due to age or medical condition  Airway Management Planned: Nasal Cannula and Natural Airway  Additional Equipment: None  Intra-op Plan:   Post-operative Plan:   Informed Consent: I have reviewed the patients History and Physical, chart, labs and discussed the procedure including the risks, benefits and alternatives for the proposed anesthesia with the patient or authorized representative who has indicated his/her understanding and acceptance.       Plan Discussed with: CRNA and Anesthesiologist  Anesthesia Plan Comments:          Anesthesia Quick Evaluation

## 2024-05-20 NOTE — H&P (Signed)
 GASTROENTEROLOGY PROCEDURE H&P NOTE   Primary Care Physician: Thurmond Cathlyn LABOR., MD  HPI: Ryan Zamora is a 78 y.o. male who presents for EGD for follow-up/surveillance of Barrett's esophagus high-grade dysplasia for additional RFA treatment.  Past Medical History:  Diagnosis Date   Allergy to alpha-gal    CAD (coronary artery disease)    Cancer (HCC)    basal cell carcinoma, left eyelid, ears   Chronic kidney disease    Enlarged prostate    GERD (gastroesophageal reflux disease)    History of Barrett's esophagus    History of hiatal hernia    History of kidney stones    Hypertension    Mixed hyperlipidemia    Osteoarthritis    PONV (postoperative nausea and vomiting)    Prediabetes    UTI (urinary tract infection)    Past Surgical History:  Procedure Laterality Date   COLONOSCOPY  09/17/2012   Small internal hemorrhoids. Otherwise normal colonoscopy to cecum.    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 Long segment Barretts esophagus. Moderate sized hiatal hernia.    ESOPHAGOGASTRODUODENOSCOPY N/A 02/29/2024   Procedure: EGD (ESOPHAGOGASTRODUODENOSCOPY);  Surgeon: Wilhelmenia Aloha Raddle., MD;  Location: [facility];  Service: Gastroenterology;  Laterality: N/A;  FNA   GI RADIOFREQUENCY ABLATION  02/29/2024   Procedure: RADIOFREQUENCY ABLATION, UPPER GI TRACT, ENDOSCOPIC;  Surgeon: Wilhelmenia, Aloha Raddle., MD;  Location[facility];  Service: Gastroenterology;;   kidney stone surgery     KNEE ARTHROSCOPY     LID LESION EXCISION Left 04/13/2017   Procedure: REMOVAL BIOPSY OF EYE LID LESION;  Surgeon: Laurie Loyd Redhead, MD;  Location: [facility];  Service: Ophthalmology;  Laterality: Left;   RECONSTRUCTION OF EYELID Left 04/13/2017   Procedure: TOTAL RECONSTRUCTION OF LOWER EYELID;  Surgeon: Laurie Loyd Redhead, MD;  Location: [facility];  Service: Ophthalmology;  Laterality: Left;   UPPER  GASTROINTESTINAL ENDOSCOPY     Current Facility-Administered Medications  Medication Dose Route Frequency Provider Last Rate Last Admin   0.9 %  sodium chloride  infusion   Intravenous Continuous Mansouraty, Aloha Raddle., MD        Current Facility-Administered Medications:    0.9 %  sodium chloride  infusion, , Intravenous, Continuous, Mansouraty, Aloha Raddle., MD No Known Allergies Family History  Problem Relation Age of Onset   CVA Mother    Heart attack Father    Colon cancer Neg Hx    Esophageal cancer Neg Hx    Colon polyps Neg Hx    Rectal cancer Neg Hx    Stomach cancer Neg Hx    Inflammatory bowel disease Neg Hx    Liver disease Neg Hx    Pancreatic cancer Neg Hx    Social History   Socioeconomic History   Marital status: Married    Spouse name: Not on file   Number of children: 2   Years of education: Not on file   Highest education level: Not on file  Occupational History   Occupation: Retired Personnel officer  Tobacco Use   Smoking status: Never   Smokeless tobacco: Never  Vaping Use   Vaping status: Never Used  Substance and Sexual Activity   Alcohol use: Yes    Comment: rare   Drug use: No   Sexual activity: Not on file  Other Topics Concern   Not on file  Social History Narrative   Not on file   Social Drivers of Health   Financial Resource Strain: Not on  file  Food Insecurity: Low Risk  (04/27/2023)   Received from [facility]   Hunger Vital Sign    Within the past 12 months, you worried that your food would run out before you got money to buy more: Never true    Within the past 12 months, the food you bought just didn't last and you didn't have money to get more. : Never true  Transportation Needs: No Transportation Needs (04/27/2023)   Received from [facility]    In the past 12 months, has lack of reliable transportation kept you from medical appointments, meetings, work or from getting things needed for daily living? : No   Physical Activity: Not on file  Stress: Not on file  Social Connections: Not on file  Intimate Partner Violence: Not on file    Physical Exam: Today's Vitals   05/17/24 1327 05/20/24 1449  BP:  (!) 169/66  Pulse:  64  Resp:  12  Temp:  97.6 F (36.4 C)  TempSrc:  Temporal  SpO2:  98%  Weight: 85 kg 86.2 kg  Height:  5' 10 (1.778 m)  PainSc:  0-No pain   Body mass index is 27.26 kg/m. GEN: NAD EYE: Sclerae anicteric ENT: MMM CV: Non-tachycardic GI: Soft, NT/ND NEURO:  Alert & Oriented x 3  Lab Results: No results for input(s): WBC, HGB, HCT, PLT in the last 72 hours. BMET No results for input(s): NA, K, CL, CO2, GLUCOSE, BUN, CREATININE, CALCIUM in the last 72 hours. LFT No results for input(s): PROT, ALBUMIN, AST, ALT, ALKPHOS, BILITOT, BILIDIR, IBILI in the last 72 hours. PT/INR No results for input(s): LABPROT, INR in the last 72 hours.   Impression / Plan: This is a 78 y.o.male who presents for EGD for follow-up/surveillance of Barrett's esophagus high-grade dysplasia for additional RFA treatment.  The risks and benefits of endoscopic evaluation/treatment were discussed with the patient and/or family; these include but are not limited to the risk of perforation, infection, bleeding, missed lesions, lack of diagnosis, severe illness requiring hospitalization, as well as anesthesia and sedation related illnesses.  The patient's history has been reviewed, patient examined, no change in status, and deemed stable for procedure.  The patient and/or family is agreeable to proceed.    Aloha Finner, MD [facility] Advanced Endoscopy Office # [phone]

## 2024-05-20 NOTE — Addendum Note (Signed)
 Addended by: SUELLEN PEERS on: 05/20/2024 05:04 PM   Modules accepted: Orders

## 2024-05-20 NOTE — Anesthesia Postprocedure Evaluation (Signed)
 Anesthesia Post Note  Patient: Ryan Zamora  Procedure(s) Performed: EGD (ESOPHAGOGASTRODUODENOSCOPY)     Patient location during evaluation: Endoscopy Anesthesia Type: MAC Level of consciousness: oriented, patient cooperative and awake and alert Pain management: pain level controlled Respiratory status: spontaneous breathing, nonlabored ventilation and respiratory function stable Cardiovascular status: blood pressure returned to baseline and stable Postop Assessment: no apparent nausea or vomiting Anesthetic complications: no   No notable events documented.  Last Vitals:  Vitals:   05/20/24 1650 05/20/24 1655  BP: (!) 141/59   Pulse: 64 66  Resp: 15 14  Temp:    SpO2: 97% 97%    Last Pain:  Vitals:   05/20/24 1645  TempSrc:   PainSc: 0-No pain                 Deniz Eskridge,E. Yates Weisgerber

## 2024-05-20 NOTE — Transfer of Care (Signed)
 Immediate Anesthesia Transfer of Care Note  Patient: Ryan Zamora  Procedure(s) Performed: EGD (ESOPHAGOGASTRODUODENOSCOPY)  Patient Location: PACU  Anesthesia Type:MAC  Level of Consciousness: awake and alert   Airway & Oxygen Therapy: Patient Spontanous Breathing and Patient connected to nasal cannula oxygen  Post-op Assessment: Report given to RN and Post -op Vital signs reviewed and stable  Post vital signs: Reviewed and stable  Last Vitals:  Vitals Value Taken Time  BP 140/56 05/20/24 16:45  Temp    Pulse 66 05/20/24 16:47  Resp 14 05/20/24 16:47  SpO2 96 % 05/20/24 16:47  Vitals shown include unfiled device data.  Last Pain:  Vitals:   05/20/24 1449  TempSrc: Temporal  PainSc: 0-No pain         Complications: No notable events documented.

## 2024-05-20 NOTE — Op Note (Signed)
 [facility] Patient Name: Ryan Zamora Procedure Date: 05/20/2024 MRN: [id number] Attending MD: Aloha Finner , MD, [id number] Date of Birth: [birth year] CSN: [id number] Age: 78 Admit Type: Outpatient Procedure:                Upper GI endoscopy Indications:              Follow-up of Barrett's esophagus, For endoscopic                            therapy of Barrett's esophagus with high grade                            dysplasia Providers:                Aloha Finner, MD, Olam Riedel, RN, Corene Southgate,                            Technician Referring MD:              Medicines:                Monitored Anesthesia Care Complications:            No immediate complications. Estimated Blood Loss:     Estimated blood loss was minimal. Procedure:                Pre-Anesthesia Assessment:                           - Prior to the procedure, a History and Physical                            was performed, and patient medications and                            allergies were reviewed. The patient's tolerance of                            previous anesthesia was also reviewed. The risks                            and benefits of the procedure and the sedation                            options and risks were discussed with the patient.                            All questions were answered, and informed consent                            was obtained. Prior Anticoagulants: The patient has                            taken no anticoagulant or antiplatelet agents. ASA                            Grade Assessment:  III - A patient with severe                            systemic disease. After reviewing the risks and                            benefits, the patient was deemed in satisfactory                            condition to undergo the procedure.                           After obtaining informed consent, the endoscope was                            passed under direct  vision. Throughout the                            procedure, the patient's blood pressure, pulse, and                            oxygen saturations were monitored continuously. The                            GIF-H190 ([id number]) Olympus endoscope was introduced                            through the mouth, and advanced to the second part                            of duodenum. The upper GI endoscopy was                            accomplished without difficulty. The patient                            tolerated the procedure. Scope In: Scope Out: Findings:      The esophagus and gastroesophageal junction were examined with white       light and narrow band imaging (NBI) from a forward view and retroflexed       position. There were esophageal mucosal changes secondary to established       long-segment Barrett's disease, classified as Barrett's stage C9-M9 per       [place] criteria. These changes involved the mucosa extending to an       irregular Z-line (25 cm from the incisors). Circumferential       salmon-colored mucosa was present from 25 to 33 cm, scattered islands of       salmon-colored mucosa were present from 25 to 33 cm and hiatal narrowing       was identified from 33 to 40 cm. This is improved from prior. The       maximum longitudinal extent of these esophageal mucosal changes was 9 cm       in length (some of this difference in sizing of the length is a result  of the hiatal hernia). Circumferential radiofrequency ablation of       Barrett's esophagus was performed using the Barrx 360 Express catheter       and balloon-based endoscopic ablation system. With the endoscope in       place, the position and extent of the Barrett's mucosa and the anatomic       landmarks including proximal and distal extent of Barrett's mucosa and       top of gastric folds were noted. Endoscopic visualization identified an       ablation site including the entire visible Barrett's segment. The        Barrett's mucosa was irrigated with N-acetylcysteine  (Mucomyst ) 1% mixed       with water . Esophageal contents were suctioned. A guidewire was passed       down the biopsy channel of the endoscope. As the endoscope was withdrawn       from the mouth, the guidewire was left in place. An auto-sizing       radiofrequency ablation balloon catheter was passed transorally over the       guidewire into the esophagus. The endoscope was introduced in a       side-by-side manner with the ablation catheter. Under direct endoscopic       visualization, the balloon ablation catheter was positioned so that the       proximal edge of the electrode was slightly above the proximal edge of       the Barrett's mucosa. The balloon was automatically inflated, and energy       was applied at 10 J/cm2. The balloon electrode was moved 4 cm distally,       so that the proximal edge of the electrode was aligned with the distal       edge of the ablation zone. The process of balloon inflation and ablation       was repeated until the top of the gastric folds was reached. The       ablation catheter and guidewire were removed, and the balloon was       cleaned. The ablation zone was then cleaned of overlying coagulative       debris using irrigation and suction via the endoscope and a cleaning       cap. The guidewire was reinserted, and then the ablation catheter was       reintroduced into the esophagus over the wire. The ablation catheter was       positioned under direct endoscopic visualization so that the proximal       edge of the electrode was at the proximal edge of the ablation zone.       Reinflation and a second round of ablation were performed with the       application of 10 J/cm2 to re-treat the Barrett's epithelium already       treated with the first round of ablation. The ablation catheter and       guidewire were then removed. The areas of the esophagus where Barrett's       mucosa had been  ablated were then examined with the endoscope. Areas of       Barrett's esophagus were completely ablated. Whitish changes of ablated       mucosa were present. The total number of energy applications for all       mucosal sites treated was 8. There was no evidence for perforation. Mild  bleeding was present.      A 7 cm hiatal hernia was present.      No gross lesions were noted in the entire examined stomach.      No gross lesions were noted in the duodenal bulb, in the first portion       of the duodenum and in the second portion of the duodenum. Impression:               - Esophageal mucosal changes secondary to                            established long-segment Barrett's disease,                            classified as Barrett's stage C9-M9 per [place]                            criteria. Treated with radiofrequency ablation.                           - 7 cm hiatal hernia.                           - No gross lesions in the entire stomach.                           - No gross lesions in the duodenal bulb, in the                            first portion of the duodenum and in the second                            portion of the duodenum. Moderate Sedation:      Not Applicable - Patient had care per Anesthesia. Recommendation:           - The patient will be observed post-procedure,                            until all discharge criteria are met.                           - Discharge patient to home.                           - Patient has a contact number available for                            emergencies. The signs and symptoms of potential                            delayed complications were discussed with the                            patient. Return to normal activities tomorrow.  Written discharge instructions were provided to the                            patient.                           - Clear liquid diet for 24 hours. Then, full liquid                             diet for 48 hours. Then, soft diet for 1 week                            thereafter. Then advance your diet thereafter to                            regular.                           - Continue your PPI 40 mg twice daily for the next                            3 months.                           - You will be prescribed a lidocaine /antiacid                            mixture that you can take up to 4 times per day as                            needed (prescription has been sent to your                            pharmacy) - take no matter what for at least first                            3-days.                           - You will be prescribed liquid Tylenol  with                            hydrocodone  that you can use up to 4 times daily                            for the next 72 hours if needed (prescription to be                            sent to your pharmacy) - I recommend using this                            even if you do not have pain for the first 2-3 days.                           -  You will be prescribed Sucralfate  Suspension and                            you will take this 4 times daily (make sure you are                            not taking any other medication 1 hour before or 1                            hour after this medication is taken) to allow                            coating and healing of your esophagus - this needs                            to be taken for at least 86-month (you should have                            refills available but call if you run out).                           - Repeat upper endoscopy in 3 months per protocol.                           - Followup in interim in clinic with Dr. Charlanne to                            ensure you are doing well.                           - The findings and recommendations were discussed                            with the patient.                           - The findings and  recommendations were discussed                            with the patient's family. Procedure Code(s):        --- Professional ---                           [phone], Esophagogastroduodenoscopy, flexible,                            transoral; with ablation of tumor(s), polyp(s), or                            other lesion(s) (includes pre- and post-dilation                            and guide wire passage, when performed) Diagnosis Code(s):        ---  Professional ---                           K22.711, Barrett's esophagus with high grade                            dysplasia CPT copyright 2022 [employer]. All rights reserved. The codes documented in this report are preliminary and upon coder review may  be revised to meet current compliance requirements. Aloha Finner, MD 05/20/2024 4:54:42 PM Number of Addenda: 0

## 2024-05-21 ENCOUNTER — Encounter (HOSPITAL_COMMUNITY): Payer: Self-pay | Admitting: Gastroenterology

## 2024-05-23 ENCOUNTER — Telehealth: Payer: Self-pay

## 2024-05-23 NOTE — Telephone Encounter (Signed)
-----   Message from [facility] sent at 05/20/2024  4:55 PM EDT ----- Regarding: Followup Jenise Iannelli or Covering RN, This patient needs a repeat EGD with me in 3 months for RFA ablation (needs to be completed by end of January). Follow-up in clinic with Dr. Charlanne in the next 6 to 8 weeks to see how he is doing. Thanks. GM  FYI RG

## 2024-05-23 NOTE — Telephone Encounter (Signed)
 OV scheduled for 11/21 at 1:50 pm with Dr. Charlanne. Left message for patient to call back.

## 2024-05-23 NOTE — Telephone Encounter (Signed)
 EGD recall entered   Pod B see note for follow up with Dr Charlanne

## 2024-05-24 NOTE — Telephone Encounter (Signed)
 Left message for patient to call back

## 2024-05-28 NOTE — Telephone Encounter (Signed)
 Pt wife Nathanel made aware OV scheduled for 11/21 at 1:50 pm with Dr. Charlanne.  Nathanel  verbalized understanding with all questions answered.

## 2024-07-05 ENCOUNTER — Ambulatory Visit: Admitting: Gastroenterology

## 2024-07-05 ENCOUNTER — Encounter: Payer: Self-pay | Admitting: Gastroenterology

## 2024-07-05 VITALS — BP 130/70 | HR 60 | Ht 70.0 in | Wt 191.2 lb

## 2024-07-05 DIAGNOSIS — K22719 Barrett's esophagus with dysplasia, unspecified: Secondary | ICD-10-CM | POA: Diagnosis not present

## 2024-07-05 DIAGNOSIS — K5909 Other constipation: Secondary | ICD-10-CM

## 2024-07-05 NOTE — Progress Notes (Signed)
 Chief Complaint: FU  Referring Provider:  Thurmond Cathlyn LABOR., MD      ASSESSMENT AND PLAN;   #1. 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 Wilhelmenia  #2. Chronic constipation- better with mitralax 17g po QD  Plan: -EGD with RFA with Dr Wilhelmenia (Jan 2026 )- TUE or WED preferably in AM -Continue protonix  40 BID   HPI:    Ryan Zamora is a 78 y.o. male  Discussed the use of AI scribe software for clinical note transcription with the patient, who gave verbal consent to proceed.  History of Present Illness Ryan Zamora is a 78 year old male with high grade dysplasia and reflux who presents for follow-up after esophageal ablation.  He underwent esophageal ablation in October for high grade dysplasia. Since the procedure, he has not experienced any problems with swallowing or weight loss. He continues to take Protonix  twice daily and confirms he has an adequate supply. His regular physician assists with medication refills.  He has a history of reflux and hiatal hernia. He has made significant changes to his eating habits.   He takes Miralax daily, which helps maintain regular bowel movements, and he has not experienced constipation.   With history of CAD, hypertension, hyperlipidemia, CRI, COPD, nephrolithiasis, alpha gal, Barrett's esophagus (long segment with LGD/HGD)   FU after EGD with Barretts ablation 10/6  He has done very well thereafter.  Denies having any significant heartburn, odynophagia or dysphagia.  Per patient's wife, he actually feels much better after previous EGD.  Wt Readings from Last 3 Encounters:  07/05/24 191 lb 4 oz (86.8 kg)  05/20/24 190 lb (86.2 kg)  04/22/24 189 lb 4 oz (85.8 kg)      Previous GI workup: EGD 05/20/2024 Dr CHRISTELLA. - Esophageal mucosal changes secondary to established long- segment Barrett' s disease, classified as Barrett' s stage C9- M9 per [place]  criteria. Treated with radiofrequency ablation. - 7 cm hiatal hernia. - No gross lesions in the entire stomach. - No gross lesions in the duodenal bulb, in the first portion of the duodenum and in the second portion of the duodenum.   EGD 02/29/2024 (Dr CHRISTELLA) - Esophageal mucosal changes secondary to established long- segment Barrett' s disease, classified as Barrett' s stage C11- M11 per [place] criteria. Treated with radiofrequency ablation using 360 RFA express. Appropriate mucosal changes noted post ablation. Mild bleeding noted that stopped on its own. No evidence of perforation.  No definite masses.- 4 cm hiatal hernia. - Erythematous mucosa in the posterior wall of the stomach. No other gross lesions in the entire stomach. Biopsied. - No gross lesions in the duodenal bulb, in the first portion of the duodenum and in the second portion of the duodenum.  EGD 11/24/2023 - Esophageal mucosal changes consistent with long- segment Barrett' s esophagus. Biopsied. - 5 cm hiatal hernia. - Gastritis. Biopsied. - Normal examined duodenum. Biopsied. -Bx: Barrett's esophagus with predominantly low-grade dysplasia and occasional foci of high-grade dysplasia.  No malignancy.  Negative small bowel biopsies for celiac.  Negative gastric biopsies for HP.  Colonoscopy 11/24/2023 - Two 4 to 6 mm polyps in the mid descending colon and in the distal descending colon, removed with a cold snare. Resected and retrieved. - Mild sigmoid diverticulosis. - Non- bleeding internal hemorrhoids. - The examined portion of the ileum was normal. - The examination was otherwise normal on direct and retroflexion views. -Bx- TA/hyperplastic. No need to  rpt.    Past Medical History:  Diagnosis Date   Allergy to alpha-gal    CAD (coronary artery disease)    Cancer (HCC)    basal cell carcinoma, left eyelid, ears   Chronic kidney disease    Enlarged prostate    GERD (gastroesophageal reflux disease)    History of Barrett's esophagus     History of hiatal hernia    History of kidney stones    Hypertension    Mixed hyperlipidemia    Osteoarthritis    PONV (postoperative nausea and vomiting)    Prediabetes    UTI (urinary tract infection)     Past Surgical History:  Procedure Laterality Date   COLONOSCOPY  09/17/2012   Small internal hemorrhoids. Otherwise normal colonoscopy to cecum.    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 Long segment Barretts esophagus. Moderate sized hiatal hernia.    ESOPHAGOGASTRODUODENOSCOPY N/A 02/29/2024   Procedure: EGD (ESOPHAGOGASTRODUODENOSCOPY);  Surgeon: Wilhelmenia Aloha Raddle., MD;  Location: [facility];  Service: Gastroenterology;  Laterality: N/A;  FNA   ESOPHAGOGASTRODUODENOSCOPY N/A 05/20/2024   Procedure: EGD (ESOPHAGOGASTRODUODENOSCOPY);  Surgeon: Wilhelmenia Aloha Raddle., MD;  Location: [facility];  Service: Gastroenterology;  Laterality: N/A;  RFA   GI RADIOFREQUENCY ABLATION  02/29/2024   Procedure: RADIOFREQUENCY ABLATION, UPPER GI TRACT, ENDOSCOPIC;  Surgeon: Wilhelmenia, Aloha Raddle., MD;  Location: [facility];  Service: Gastroenterology;;   kidney stone surgery     KNEE ARTHROSCOPY     LID LESION EXCISION Left 04/13/2017   Procedure: REMOVAL BIOPSY OF EYE LID LESION;  Surgeon: Laurie Loyd Redhead, MD;  Location: [facility];  Service: Ophthalmology;  Laterality: Left;   RECONSTRUCTION OF EYELID Left 04/13/2017   Procedure: TOTAL RECONSTRUCTION OF LOWER EYELID;  Surgeon: Laurie Loyd Redhead, MD;  Location: [facility];  Service: Ophthalmology;  Laterality: Left;   UPPER GASTROINTESTINAL ENDOSCOPY      Family History  Problem Relation Age of Onset   CVA Mother    Heart attack Father    Colon cancer Neg Hx    Esophageal cancer Neg Hx    Colon polyps Neg Hx    Rectal cancer Neg Hx    Stomach cancer Neg Hx    Inflammatory bowel disease Neg Hx    Liver disease Neg Hx    Pancreatic cancer Neg Hx      Social History   Tobacco Use   Smoking status: Never   Smokeless tobacco: Never  Vaping Use   Vaping status: Never Used  Substance Use Topics   Alcohol use: Yes    Comment: rare   Drug use: No    Current Outpatient Medications  Medication Sig Dispense Refill   alfuzosin (UROXATRAL) 10 MG 24 hr tablet Take 10 mg by mouth daily.     allopurinol (ZYLOPRIM) 300 MG tablet Take 300 mg by mouth every evening.     cyanocobalamin (VITAMIN B12) 1000 MCG tablet Take 1 tablet by mouth daily.     diclofenac (VOLTAREN) 75 MG EC tablet Take 75 mg by mouth 2 (two) times daily.     finasteride (PROSCAR) 5 MG tablet Take 5 mg by mouth daily.     lisinopril (PRINIVIL,ZESTRIL) 10 MG tablet Take 10 mg by mouth daily.     metoprolol succinate (TOPROL-XL) 25 MG 24 hr tablet Take 25 mg by mouth 2 (two) times daily.     pantoprazole  (PROTONIX ) 40 MG tablet Take 1 tablet (40 mg total) by mouth 2 (  two) times daily. 60 tablet 11   simvastatin (ZOCOR) 20 MG tablet Take 20 mg by mouth every evening.     GI Cocktail (alum & mag hydroxide, lidocaine , dicyclomine ) oral mixture Take 30 mLs by mouth in the morning, at noon, in the evening, and at bedtime. 90 ml lidocaine  90 ml 10 mg/5 ml dicyclomine  270 ml maalox Swish and swallow 30 ml four times daily (Patient not taking: Reported on 07/05/2024) 550 mL 0   No current facility-administered medications for this visit.    No Known Allergies  Review of Systems:  Constitutional: Denies fever, chills, diaphoresis, appetite change and fatigue.  HEENT: Denies photophobia, eye pain, redness, hearing loss, ear pain, congestion, sore throat, rhinorrhea, sneezing, mouth sores, neck pain, neck stiffness and tinnitus.   Respiratory: Denies SOB, DOE, cough, chest tightness,  and wheezing.   Cardiovascular: Denies chest pain, palpitations and leg swelling.  Genitourinary: Denies dysuria, urgency, frequency, hematuria, flank pain and difficulty urinating.  Musculoskeletal:  Denies myalgias, back pain, joint swelling, arthralgias and gait problem.  Skin: No rash.  Neurological: Denies dizziness, seizures, syncope, weakness, light-headedness, numbness and headaches.  Hematological: Denies adenopathy. Easy bruising, personal or family bleeding history  Psychiatric/Behavioral: No anxiety or depression     Physical Exam:    BP 130/70   Pulse 60   Ht 5' 10 (1.778 m)   Wt 191 lb 4 oz (86.8 kg)   BMI 27.44 kg/m  Wt Readings from Last 3 Encounters:  07/05/24 191 lb 4 oz (86.8 kg)  05/20/24 190 lb (86.2 kg)  04/22/24 189 lb 4 oz (85.8 kg)   Constitutional:  Well-developed, in no acute distress. Psychiatric: Normal mood and affect. Behavior is normal. HEENT: Pupils normal.  Conjunctivae are normal. No scleral icterus. Neck supple.  Cardiovascular: Normal rate, regular rhythm. No edema Pulmonary/chest: Effort normal and breath sounds normal. No wheezing, rales or rhonchi. Abdominal: Soft, nondistended. Nontender. Bowel sounds active throughout. There are no masses palpable. No hepatomegaly. Rectal: Deferred Neurological: Alert and oriented to person place and time. Skin: Skin is warm and dry. No rashes noted.  Data Reviewed: I have personally reviewed following labs and imaging studies  CBC:    Latest Ref Rng & Units 04/22/2024    9:20 AM 04/13/2017    6:47 AM  CBC  WBC 4.0 - 10.5 K/uL 7.5  7.6   Hemoglobin 13.0 - 17.0 g/dL 85.3  84.0   Hematocrit 39.0 - 52.0 % 43.5  46.1   Platelets 150.0 - 400.0 K/uL 160.0  159     CMP:    Latest Ref Rng & Units 04/22/2024    9:20 AM 04/13/2017    6:47 AM  CMP  Glucose 70 - 99 mg/dL 850  874   BUN 6 - 23 mg/dL 21  14   Creatinine 9.59 - 1.50 mg/dL 8.95  9.06   Sodium 864 - 145 mEq/L 138  139   Potassium 3.5 - 5.1 mEq/L 3.8  3.9   Chloride 96 - 112 mEq/L 107  111   CO2 19 - 32 mEq/L 22  21   Calcium 8.4 - 10.5 mg/dL 89.4  9.8   Total Protein 6.0 - 8.3 g/dL 7.1    Total Bilirubin 0.2 - 1.2 mg/dL 0.4     Alkaline Phos 39 - 117 U/L 64    AST 0 - 37 U/L 18    ALT 0 - 53 U/L 31          Anselm Bring,  MD 07/05/2024, 2:17 PM  Cc: Thurmond Cathlyn LABOR., MD

## 2024-07-05 NOTE — Patient Instructions (Addendum)
 _______________________________________________________  If your blood pressure at your visit was 140/90 or greater, please contact your primary care physician to follow up on this.  _______________________________________________________  If you are age 77 or older, your body mass index should be between 23-30. Your Body mass index is 27.44 kg/m. If this is out of the aforementioned range listed, please consider follow up with your Primary Care Provider.  If you are age 7 or younger, your body mass index should be between 19-25. Your Body mass index is 27.44 kg/m. If this is out of the aformentioned range listed, please consider follow up with your Primary Care Provider.   ________________________________________________________  The [facility] providers would like to encourage you to use MYCHART to communicate with providers for non-urgent requests or questions.  Due to long hold times on the telephone, sending your provider a message by [facility] may be a faster and more efficient way to get a response.  Please allow 48 business hours for a response.  Please remember that this is for non-urgent requests.  _______________________________________________________  [facility] is using a team-based approach to care.  Your team is made up of your doctor and two to three APPS. Our APPS (Nurse Practitioners and Physician Assistants) work with your physician to ensure care continuity for you. They are fully qualified to address your health concerns and develop a treatment plan. They communicate directly with your gastroenterologist to care for you. Seeing the Advanced Practice Practitioners on your physician's team can help you by facilitating care more promptly, often allowing for earlier appointments, access to diagnostic testing, procedures, and other specialty referrals.  Continue Protonix  2 times a day   Please call us  in 2 weeks to the nurse regarding your procedure with Dr  Wilhelmenia for January at the hospital  Thank you,  Dr. Lynnie Bring

## 2024-08-28 ENCOUNTER — Telehealth: Payer: Self-pay

## 2024-08-28 ENCOUNTER — Other Ambulatory Visit: Payer: Self-pay

## 2024-08-28 DIAGNOSIS — K22719 Barrett's esophagus with dysplasia, unspecified: Secondary | ICD-10-CM

## 2024-08-28 DIAGNOSIS — K22711 Barrett's esophagus with high grade dysplasia: Secondary | ICD-10-CM

## 2024-08-28 DIAGNOSIS — K219 Gastro-esophageal reflux disease without esophagitis: Secondary | ICD-10-CM

## 2024-08-28 DIAGNOSIS — K2100 Gastro-esophageal reflux disease with esophagitis, without bleeding: Secondary | ICD-10-CM

## 2024-08-28 NOTE — Telephone Encounter (Signed)
-----   Message from [facility] Florence H sent at 08/13/2024 11:02 AM EST ----- Regarding: FW: EGD with RFA with Dr Wilhelmenia  ----- Message ----- From: Anitra Odetta CROME, RN Sent: 07/05/2024   2:48 PM EST To: Lyle FORBES Ishihara, CMA Subject: RE: EGD with RFA with Dr Wilhelmenia            It will have to go to Dr Wilhelmenia to review. Have Dr Charlanne send him the note ----- Message ----- From: Ishihara Lyle FORBES, CMA Sent: 07/05/2024   2:38 PM EST To: Odetta CROME Anitra, RN Subject: EGD with RFA with Dr Wilhelmenia Harari I need help. Dr Charlanne wants this patient to be set up for an EGD with RFA with Dr Wilhelmenia (Jan 2026 )- TUE or WED preferably in AM

## 2024-08-28 NOTE — Telephone Encounter (Signed)
 Patient will be scheduled for 10/09/24 at [facility] for EGD with RFA. All Instructions will be sent to patient via my chart and by mail. Went over all instructions with patient's wife.

## 2024-10-09 ENCOUNTER — Ambulatory Visit (HOSPITAL_COMMUNITY): Admit: 2024-10-09 | Admitting: Gastroenterology

## 2024-10-09 ENCOUNTER — Encounter (HOSPITAL_COMMUNITY): Payer: Self-pay

## 2024-10-09 SURGERY — EGD (ESOPHAGOGASTRODUODENOSCOPY)
Anesthesia: Monitor Anesthesia Care
